# Patient Record
Sex: Male | Born: 1940 | Race: Black or African American | Hispanic: No | Marital: Married | Smoking: Former smoker
Health system: Southern US, Community
[De-identification: ages and names within clinical notes are randomized; demographics above are authoritative.]

## PROBLEM LIST (undated history)

## (undated) DIAGNOSIS — H269 Unspecified cataract: Secondary | ICD-10-CM

## (undated) DIAGNOSIS — H409 Unspecified glaucoma: Secondary | ICD-10-CM

## (undated) DIAGNOSIS — I10 Essential (primary) hypertension: Secondary | ICD-10-CM

## (undated) DIAGNOSIS — M109 Gout, unspecified: Secondary | ICD-10-CM

## (undated) HISTORY — PX: KNEE SURGERY: SHX244

## (undated) HISTORY — PX: MANDIBLE FRACTURE SURGERY: SHX706

## (undated) HISTORY — PX: CATARACT EXTRACTION: SUR2

## (undated) HISTORY — PX: COLONOSCOPY: SHX174

---

## 2005-04-07 ENCOUNTER — Emergency Department (HOSPITAL_COMMUNITY): Admission: EM | Admit: 2005-04-07 | Discharge: 2005-04-07 | Payer: Self-pay | Admitting: Emergency Medicine

## 2005-04-08 ENCOUNTER — Emergency Department (HOSPITAL_COMMUNITY): Admission: EM | Admit: 2005-04-08 | Discharge: 2005-04-08 | Payer: Self-pay | Admitting: Emergency Medicine

## 2005-04-10 ENCOUNTER — Emergency Department (HOSPITAL_COMMUNITY): Admission: EM | Admit: 2005-04-10 | Discharge: 2005-04-10 | Payer: Self-pay | Admitting: Emergency Medicine

## 2005-04-15 ENCOUNTER — Emergency Department (HOSPITAL_COMMUNITY): Admission: EM | Admit: 2005-04-15 | Discharge: 2005-04-15 | Payer: Self-pay | Admitting: Emergency Medicine

## 2014-12-08 DIAGNOSIS — Z961 Presence of intraocular lens: Secondary | ICD-10-CM | POA: Insufficient documentation

## 2014-12-08 DIAGNOSIS — H401113 Primary open-angle glaucoma, right eye, severe stage: Secondary | ICD-10-CM | POA: Insufficient documentation

## 2014-12-08 DIAGNOSIS — H2512 Age-related nuclear cataract, left eye: Secondary | ICD-10-CM | POA: Insufficient documentation

## 2015-10-15 ENCOUNTER — Emergency Department (HOSPITAL_COMMUNITY): Payer: Medicare HMO

## 2015-10-15 ENCOUNTER — Encounter (HOSPITAL_COMMUNITY): Payer: Self-pay | Admitting: Emergency Medicine

## 2015-10-15 ENCOUNTER — Other Ambulatory Visit: Payer: Self-pay

## 2015-10-15 ENCOUNTER — Observation Stay (HOSPITAL_COMMUNITY): Payer: Medicare HMO

## 2015-10-15 ENCOUNTER — Emergency Department (HOSPITAL_BASED_OUTPATIENT_CLINIC_OR_DEPARTMENT_OTHER): Payer: Medicare HMO

## 2015-10-15 ENCOUNTER — Inpatient Hospital Stay (HOSPITAL_COMMUNITY)
Admission: EM | Admit: 2015-10-15 | Discharge: 2015-10-18 | DRG: 176 | Disposition: A | Discharge status: Home / Self Care | Payer: Medicare HMO | Attending: Family Medicine | Admitting: Family Medicine

## 2015-10-15 DIAGNOSIS — I2699 Other pulmonary embolism without acute cor pulmonale: Secondary | ICD-10-CM

## 2015-10-15 DIAGNOSIS — Z87891 Personal history of nicotine dependence: Secondary | ICD-10-CM | POA: Diagnosis not present

## 2015-10-15 DIAGNOSIS — R0602 Shortness of breath: Secondary | ICD-10-CM | POA: Diagnosis not present

## 2015-10-15 DIAGNOSIS — H409 Unspecified glaucoma: Secondary | ICD-10-CM | POA: Diagnosis present

## 2015-10-15 DIAGNOSIS — I10 Essential (primary) hypertension: Secondary | ICD-10-CM | POA: Diagnosis present

## 2015-10-15 DIAGNOSIS — Z79899 Other long term (current) drug therapy: Secondary | ICD-10-CM

## 2015-10-15 DIAGNOSIS — H548 Legal blindness, as defined in USA: Secondary | ICD-10-CM | POA: Diagnosis present

## 2015-10-15 DIAGNOSIS — M109 Gout, unspecified: Secondary | ICD-10-CM | POA: Diagnosis present

## 2015-10-15 DIAGNOSIS — I2609 Other pulmonary embolism with acute cor pulmonale: Secondary | ICD-10-CM | POA: Diagnosis not present

## 2015-10-15 HISTORY — DX: Essential (primary) hypertension: I10

## 2015-10-15 HISTORY — DX: Unspecified glaucoma: H40.9

## 2015-10-15 HISTORY — DX: Unspecified cataract: H26.9

## 2015-10-15 HISTORY — DX: Gout, unspecified: M10.9

## 2015-10-15 LAB — BASIC METABOLIC PANEL
ANION GAP: 10 (ref 5–15)
BUN: 18 mg/dL (ref 6–20)
CO2: 20 mmol/L — AB (ref 22–32)
Calcium: 9.1 mg/dL (ref 8.9–10.3)
Chloride: 110 mmol/L (ref 101–111)
Creatinine, Ser: 1.39 mg/dL — ABNORMAL HIGH (ref 0.61–1.24)
GFR calc Af Amer: 55 mL/min — ABNORMAL LOW (ref >60)
GFR calc non Af Amer: 48 mL/min — ABNORMAL LOW (ref >60)
GLUCOSE: 107 mg/dL — AB (ref 65–99)
POTASSIUM: 3.5 mmol/L (ref 3.5–5.1)
Sodium: 140 mmol/L (ref 135–145)

## 2015-10-15 LAB — CBC WITH DIFFERENTIAL/PLATELET
BASOS ABS: 0 10*3/uL (ref 0.0–0.1)
Basophils Relative: 0 %
EOS ABS: 0.2 10*3/uL (ref 0.0–0.7)
EOS PCT: 3 %
HCT: 38 % — ABNORMAL LOW (ref 39.0–52.0)
HEMOGLOBIN: 13.1 g/dL (ref 13.0–17.0)
Lymphocytes Relative: 34 %
Lymphs Abs: 2.3 10*3/uL (ref 0.7–4.0)
MCH: 30.3 pg (ref 26.0–34.0)
MCHC: 34.5 g/dL (ref 30.0–36.0)
MCV: 87.8 fL (ref 78.0–100.0)
Monocytes Absolute: 0.3 10*3/uL (ref 0.1–1.0)
Monocytes Relative: 4 %
NEUTROS PCT: 59 %
Neutro Abs: 3.8 10*3/uL (ref 1.7–7.7)
PLATELETS: 155 10*3/uL (ref 150–400)
RBC: 4.33 MIL/uL (ref 4.22–5.81)
RDW: 12.6 % (ref 11.5–15.5)
WBC: 6.6 10*3/uL (ref 4.0–10.5)

## 2015-10-15 LAB — ECHOCARDIOGRAM COMPLETE
CHL CUP TV REG PEAK VELOCITY: 455 cm/s
E decel time: 317 msec
FS: 35 % (ref 28–44)
HEIGHTINCHES: 70 in
IVS/LV PW RATIO, ED: 0.99
LA vol index: 30.9 mL/m2
LA vol: 67 mL
LADIAMINDEX: 1.75 cm/m2
LASIZE: 38 mm
LAVOLA4C: 61.4 mL
LEFT ATRIUM END SYS DIAM: 38 mm
LV dias vol index: 29 mL/m2
LVDIAVOL: 64 mL (ref 62–150)
LVOT area: 3.8 cm2
LVOT diameter: 22 mm
LVSYSVOL: 19 mL — AB (ref 21–61)
LVSYSVOLIN: 9 mL/m2
MV Dec: 317
MV pk E vel: 78 m/s
MVPG: 2 mmHg
MVPKAVEL: 114 m/s
PW: 12.5 mm — AB (ref 0.6–1.1)
Simpson's disk: 71
Stroke v: 45 ml
TAPSE: 27.8 mm
TR max vel: 455 cm/s
WEIGHTICAEL: 3520 [oz_av]

## 2015-10-15 LAB — TROPONIN I

## 2015-10-15 LAB — BRAIN NATRIURETIC PEPTIDE: B Natriuretic Peptide: 42 pg/mL (ref 0.0–100.0)

## 2015-10-15 MED ORDER — SODIUM CHLORIDE 0.9 % IV SOLN: 250.0000 mL | INTRAVENOUS | Status: DC | PRN

## 2015-10-15 MED ORDER — SODIUM CHLORIDE 0.9% FLUSH
3.0000 mL | Freq: Two times a day (BID) | INTRAVENOUS | Status: DC
  Administered 2015-10-15 – 2015-10-18 (×6): 3 mL via INTRAVENOUS

## 2015-10-15 MED ORDER — SODIUM CHLORIDE 0.9 % IV SOLN
INTRAVENOUS | Status: DC
  Administered 2015-10-15: 10:00:00 via INTRAVENOUS

## 2015-10-15 MED ORDER — ALBUTEROL SULFATE (2.5 MG/3ML) 0.083% IN NEBU
2.5000 mg | INHALATION_SOLUTION | Freq: Once | RESPIRATORY_TRACT | Status: AC
  Administered 2015-10-15: 2.5 mg via RESPIRATORY_TRACT
  Filled 2015-10-15: qty 3

## 2015-10-15 MED ORDER — ONDANSETRON HCL 4 MG PO TABS: 4.0000 mg | ORAL_TABLET | Freq: Four times a day (QID) | ORAL | Status: DC | PRN

## 2015-10-15 MED ORDER — ACETAMINOPHEN 325 MG PO TABS: 650.0000 mg | ORAL_TABLET | Freq: Four times a day (QID) | ORAL | Status: DC | PRN

## 2015-10-15 MED ORDER — HEPARIN (PORCINE) IN NACL 100-0.45 UNIT/ML-% IJ SOLN
1500.0000 [IU]/h | INTRAMUSCULAR | Status: DC
  Administered 2015-10-15: 1500 [IU]/h via INTRAVENOUS
  Filled 2015-10-15: qty 250

## 2015-10-15 MED ORDER — APIXABAN 5 MG PO TABS: 5.0000 mg | ORAL_TABLET | Freq: Two times a day (BID) | ORAL | Status: DC

## 2015-10-15 MED ORDER — ACETAMINOPHEN 650 MG RE SUPP: 650.0000 mg | Freq: Four times a day (QID) | RECTAL | Status: DC | PRN

## 2015-10-15 MED ORDER — SODIUM CHLORIDE 0.9 % IV SOLN
INTRAVENOUS | Status: AC
  Administered 2015-10-15: 13:00:00 via INTRAVENOUS

## 2015-10-15 MED ORDER — APIXABAN 5 MG PO TABS
10.0000 mg | ORAL_TABLET | Freq: Two times a day (BID) | ORAL | Status: DC
  Administered 2015-10-15 – 2015-10-18 (×6): 10 mg via ORAL
  Filled 2015-10-15 (×6): qty 2

## 2015-10-15 MED ORDER — SODIUM CHLORIDE 0.9% FLUSH: 3.0000 mL | INTRAVENOUS | Status: DC | PRN

## 2015-10-15 MED ORDER — SENNOSIDES-DOCUSATE SODIUM 8.6-50 MG PO TABS: 1.0000 | ORAL_TABLET | Freq: Every evening | ORAL | Status: DC | PRN

## 2015-10-15 MED ORDER — IOPAMIDOL (ISOVUE-370) INJECTION 76%
100.0000 mL | Freq: Once | INTRAVENOUS | Status: AC | PRN
  Administered 2015-10-15: 100 mL via INTRAVENOUS

## 2015-10-15 MED ORDER — HEPARIN BOLUS VIA INFUSION
5000.0000 [IU] | Freq: Once | INTRAVENOUS | Status: AC
  Administered 2015-10-15: 5000 [IU] via INTRAVENOUS

## 2015-10-15 MED ORDER — ONDANSETRON HCL 4 MG/2ML IJ SOLN: 4.0000 mg | Freq: Four times a day (QID) | INTRAMUSCULAR | Status: DC | PRN

## 2015-10-15 MED ORDER — IPRATROPIUM-ALBUTEROL 0.5-2.5 (3) MG/3ML IN SOLN
3.0000 mL | Freq: Once | RESPIRATORY_TRACT | Status: AC
  Administered 2015-10-15: 3 mL via RESPIRATORY_TRACT
  Filled 2015-10-15: qty 3

## 2015-10-15 NOTE — ED Notes (Signed)
Patient with c/o shortness of breath x 2 weeks with worsening symptoms over last 2 days. C/o worse breathing on left. + extremity swelling. 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 Patient alert. Work of breathing increase with getting to bed from wheelchair.

## 2015-10-15 NOTE — ED Notes (Signed)
Pt placed on 2L nasal cannula.

## 2015-10-15 NOTE — ED Provider Notes (Signed)
CSN: [id number]     Arrival date & time 10/15/15  0909 History   First MD Initiated Contact with Patient 10/15/15 0912     Chief Complaint  Patient presents with  . Shortness of Breath     HPI  Pt was seen at 0915.  Per pt, c/o gradual onset and worsening of persistent SOB for the past 2 weeks, worse over the past 2 days. SOB worsens on exertion. Has been associated with increasing pedal edema bilaterally. Pt states he "went to my doctor and he told me I was ok."  Denies CP/palpitations, no cough, no abd pain, no N/V/D, no back pain, no fevers, no calf/LE pain or unilateral swelling.     Past Medical History  Diagnosis Date  . Glaucoma   . Cataract   . Hypertension   . Gout    Past Surgical History  Procedure Laterality Date  . Knee surgery    . Cataract extraction    . Mandible fracture surgery    . Colonoscopy      Social History  Substance Use Topics  . Smoking status: Former Games developer  . Smokeless tobacco: None  . Alcohol Use: No    Review of Systems ROS: Statement: All systems negative except as marked or noted in the HPI; Constitutional: Negative for fever and chills. ; ; Eyes: Negative for eye pain, redness and discharge. ; ; ENMT: Negative for ear pain, hoarseness, nasal congestion, sinus pressure and sore throat. ; ; Cardiovascular: Negative for chest pain, palpitations, diaphoresis, +dyspnea, +peripheral edema. ; ; Respiratory: Negative for cough, wheezing and stridor. ; ; Gastrointestinal: Negative for nausea, vomiting, diarrhea, abdominal pain, blood in stool, hematemesis, jaundice and rectal bleeding. . ; ; Genitourinary: Negative for dysuria, flank pain and hematuria. ; ; Musculoskeletal: Negative for back pain and neck pain. Negative for swelling and trauma.; ; Skin: Negative for pruritus, rash, abrasions, blisters, bruising and skin lesion.; ; Neuro: Negative for headache, lightheadedness and neck stiffness. Negative for weakness, altered level of consciousness,  altered mental status, extremity weakness, paresthesias, involuntary movement, seizure and syncope.     Allergies  Review of patient's allergies indicates no known allergies.  Home Medications   Prior to Admission medications   Not on File   BP 152/78 mmHg  Pulse 89  Temp(Src) 98.2 F (36.8 C) (Oral)  Resp 18  Ht  (1.778 m)  Wt 220 lb (99.791 kg)  BMI 31.57 kg/m2  SpO2 91%   Patient Vitals for the past 24 hrs:  BP Temp Temp src Pulse Resp SpO2 Height Weight  10/15/15 1130 152/78 mmHg - - 89 18 91 % - -  10/15/15 1104 - - - - - -  (1.778 m) 220 lb (99.791 kg)  10/15/15 1100 147/81 mmHg - - 72 16 98 % - -  10/15/15 1052 - - - - - 97 % - -  10/15/15 1000 134/73 mmHg - - 86 20 91 % - -  10/15/15 0930 167/79 mmHg 98.2 F (36.8 C) Oral 96 25 92 % - -     Physical Exam  0920: Physical examination:  Nursing notes reviewed; Vital signs and O2 SAT reviewed;  Constitutional: Well developed, Well nourished, Well hydrated, In no acute distress; Head:  Normocephalic, atraumatic; Eyes: EOMI, PERRL, No scleral icterus; ENMT: Mouth and pharynx normal, Mucous membranes moist; Neck: Supple, Full range of motion, No lymphadenopathy; Cardiovascular: Regular rate and rhythm, No gallop; Respiratory: Breath sounds coarse & equal bilaterally, No wheezes.  Speaking full sentences with ease, Normal respiratory effort/excursion; Chest: Nontender, Movement normal; Abdomen: Soft, Nontender, Nondistended, Normal bowel sounds; Genitourinary: No CVA tenderness; Extremities: Pulses normal, No tenderness, +chronic bilat ankles/lower legs deformities. +2 pedal edema bilat. No calf edema or asymmetry.; Neuro: AA&Ox3, Major CN grossly intact.  Speech clear. No gross focal motor or sensory deficits in extremities.; Skin: Color normal, Warm, Dry.    ED Course  Procedures (including critical care time) Labs Review   Imaging Review  I have personally reviewed and evaluated these images and lab results  as part of my medical decision-making.   EKG Interpretation   Date/Time:  Friday October 15 2015 09:17:50 EDT Ventricular Rate:  101 PR Interval:    QRS Duration: 94 QT Interval:  346 QTC Calculation: 449 R Axis:   -15 Text Interpretation:  Sinus tachycardia Borderline prolonged PR interval  Borderline left axis deviation Baseline wander No old tracing to compare  Confirmed by [facility]  MD, Nicholos JohnsKATHLEEN [id number]) on 10/15/2015 9:36:24 AM      MDM  MDM Reviewed: previous chart, nursing note and vitals Reviewed previous: labs and ECG Interpretation: labs, ECG, x-ray and CT scan Total time providing critical care: 30-74 minutes. This excludes time spent performing separately reportable procedures and services. Consults: critical care and admitting MD   CRITICAL CARE Performed by: Laray AngerMCMANUS,Jamicia Haaland M Total critical care time: 35 minutes Critical care time was exclusive of separately billable procedures and treating other patients. Critical care was necessary to treat or prevent imminent or life-threatening deterioration. Critical care was time spent personally by me on the following activities: development of treatment plan with patient and/or surrogate as well as nursing, discussions with consultants, evaluation of patient's response to treatment, examination of patient, obtaining history from patient or surrogate, ordering and performing treatments and interventions, ordering and review of laboratory studies, ordering and review of radiographic studies, pulse oximetry and re-evaluation of patient's condition.   Results for orders placed or performed during the hospital encounter of 10/15/15  Basic metabolic panel  Result Value Ref Range   Sodium 140 135 - 145 mmol/L   Potassium 3.5 3.5 - 5.1 mmol/L   Chloride 110 101 - 111 mmol/L   CO2 20 (L) 22 - 32 mmol/L   Glucose, Bld 107 (H) 65 - 99 mg/dL   BUN 18 6 - 20 mg/dL   Creatinine, Ser 9.811.39 (H) 0.61 - 1.24 mg/dL   Calcium 9.1 8.9 - 19.110.3  mg/dL   GFR calc non Af Amer 48 (L) >60 mL/min   GFR calc Af Amer 55 (L) >60 mL/min   Anion gap 10 5 - 15  Brain natriuretic peptide  Result Value Ref Range   B Natriuretic Peptide 42.0 0.0 - 100.0 pg/mL  Troponin I  Result Value Ref Range   Troponin I <0.03 <0.031 ng/mL  CBC with Differential  Result Value Ref Range   WBC 6.6 4.0 - 10.5 K/uL   RBC 4.33 4.22 - 5.81 MIL/uL   Hemoglobin 13.1 13.0 - 17.0 g/dL   HCT 47.838.0 (L) 29.539.0 - 62.152.0 %   MCV 87.8 78.0 - 100.0 fL   MCH 30.3 26.0 - 34.0 pg   MCHC 34.5 30.0 - 36.0 g/dL   RDW 30.812.6 65.711.5 - 84.615.5 %   Platelets 155 150 - 400 K/uL   Neutrophils Relative % 59 %   Neutro Abs 3.8 1.7 - 7.7 K/uL   Lymphocytes Relative 34 %   Lymphs Abs 2.3 0.7 - 4.0 K/uL   Monocytes  Relative 4 %   Monocytes Absolute 0.3 0.1 - 1.0 K/uL   Eosinophils Relative 3 %   Eosinophils Absolute 0.2 0.0 - 0.7 K/uL   Basophils Relative 0 %   Basophils Absolute 0.0 0.0 - 0.1 K/uL   Dg Chest 2 View 10/15/2015  CLINICAL DATA:  Shortness of breath for 1 week, worsening. Initial encounter. EXAM: CHEST  2 VIEW COMPARISON:  None. FINDINGS: There is cardiomegaly without edema. Blunting at the left costophrenic angle could be due to scar or small effusion. The lungs are clear. No pneumothorax. IMPRESSION: Cardiomegaly without edema. Blunting of the left costophrenic angle compatible with scar or a very small effusion. Electronically Signed   By: Drusilla Kannerhomas  Dalessio M.D.   On: 10/15/2015 09:55   Ct Angio Chest Pe W/cm &/or Wo Cm 10/15/2015  CLINICAL DATA:  Shortness of breath for 2 weeks, worsened over the past 2 days. Left lower extremity swelling. Initial encounter. EXAM: CT ANGIOGRAPHY CHEST WITH CONTRAST TECHNIQUE: Multidetector CT imaging of the chest was performed using the standard protocol during bolus administration of intravenous contrast. Multiplanar CT image reconstructions and MIPs were obtained to evaluate the vascular anatomy. CONTRAST:  100 cc Isovue 370. COMPARISON:  PA  and lateral chest earlier today. FINDINGS: The study is positive for extensive bilateral pulmonary emboli. The RV to LV ratio is 1.1 consistent with right heart strain. Small bilateral pleural effusions are seen and there is a small pericardial effusion. Heart size is mildly enlarged. Calcific aortic and coronary atherosclerosis is seen. Lungs demonstrate emphysematous disease and some dependent atelectasis. Visualized upper abdomen shows reflux of contrast into the inferior vena cava and hepatic veins consistent with right heart insufficiency. Imaged upper abdomen is otherwise unremarkable. Thoracic spondylosis is noted. No focal bony abnormality. Review of the MIP images confirms the above findings. IMPRESSION: Positive for acute PE with CT evidence of right heart strain (RV/LV Ratio = 1.1) consistent with at least submassive (intermediate risk) PE. The presence of right heart strain has been associated with an increased risk of morbidity and mortality. Please activate Code PE by paging [phone]. Small bilateral pleural effusions and pericardial effusion. Calcific aortic and coronary atherosclerosis. Critical Value/emergent results were called by telephone at the time of interpretation on 10/15/2015 at 10:43 am to Dr. Samuel JesterKATHLEEN Arial Galligan , who verbally acknowledged these results. Electronically Signed   By: Drusilla Kannerhomas  Dalessio M.D.   On: 10/15/2015 10:44    1043:  CT scan as above. IV heparin started. Code PE called. Pt VS remain stable, Sats 91-92% R/A. T/C from PCC[facility] Dr. Delton CoombesByrum, case discussed, including:  HPI, pertinent PM/SHx, VS/PE, dx testing, ED course and treatment:  Recommends start IV heparin, obtain Echocardiogram, [facility]  T/C to [facility] Dr. Ardyth HarpsHernandez, case discussed, including:  HPI, pertinent PM/SHx, VS/PE, dx testing, ED course and treatment:  Agreeable to admit, requests to write temporary orders, obtain stepdown bed to team APAdmits.   Samuel JesterKathleen Tai Skelly, DO 10/20/15 1242

## 2015-10-15 NOTE — Progress Notes (Addendum)
ANTICOAGULATION CONSULT NOTE - Initial Consult  Pharmacy Consult for heparin Indication: pulmonary embolus  Allergies  Allergen Reactions  . Codeine Other (See Comments)    "whelps and knots".    Patient Measurements:   Heparin Dosing Weight: 93.8  Vital Signs: Temp: 98.2 F (36.8 C) (06/23 0930) Temp Source: Oral (06/23 0930) BP: 134/73 mmHg (06/23 1000) Pulse Rate: 86 (06/23 1000)  Labs:  Recent Labs  10/15/15 0920  HGB 13.1  HCT 38.0*  PLT 155  CREATININE 1.39*  TROPONINI <0.03    CrCl cannot be calculated (Unknown ideal weight.).   Medical History: Past Medical History  Diagnosis Date  . Glaucoma   . Cataract   . Hypertension   . Gout     Medications:  See medication history  Assessment: 75 yo man with SOB for 2 weeks found to have submassive PE with evidence of right heart strain to start heparin therapy.  He was not on anticoagulation PTA. Goal of Therapy:  Heparin level 0.3-0.7 units/ml Monitor platelets by anticoagulation protocol: Yes   Plan:  Heparin 5000 units bolus and drip at 1500 units/hr Check heparin level 6 hours after start Daily heparin level and CBC while on heparin Monitor for bleeding complications  Thanks for allowing pharmacy to be a part of this patient's care.  Talbert CageLora Norwood Quezada, PharmD Clinical Pharmacist 10/15/2015,10:59 AM  Change to eliquis 10 mg bid for 7 days then 5 mg bid. Talbert CageLora Ewel Lona, PharmD

## 2015-10-15 NOTE — ED Notes (Signed)
Respiratory at bedside.

## 2015-10-15 NOTE — H&P (Signed)
History and Physical    James BinetFloyd W Axley ZOX:[id number] DOB: 07/23/1940 DOA: 10/15/2015  Referring MD/NP/PA: Samuel JesterKathleen Page, EDP [facility]  Patient coming from: Home  Chief Complaint: Shortness of breath  HPI: James Page is a 75 y.o. male with history significant for hypertension and glaucoma causing blindness who comes to the hospital with shortness of breath. He states has been present for about 3 weeks and has been progressive but got particularly worse last night prompting his ED visit today. Not hypoxic on arrival, hemodynamically stable, CT image of the chest showed acute PE with CT evidence for right heart strain consistent with at least massive PE. Admission has been requested for further evaluation and management.  Past Medical/Surgical History: Past Medical History  Diagnosis Date  . Glaucoma   . Cataract   . Hypertension   . Gout     Past Surgical History  Procedure Laterality Date  . Knee surgery    . Cataract extraction    . Mandible fracture surgery    . Colonoscopy     Social History:  reports that he has quit smoking. He does not have any smokeless tobacco history on file. He reports that he does not drink alcohol or use illicit drugs.  Allergies: Allergies  Allergen Reactions  . Codeine Other (See Comments)    "whelps and knots".    Family History:  No heart disease, stroke in either parent  Prior to Admission medications   Medication Sig Start Date End Date Taking? Authorizing Provider  colchicine 0.6 MG tablet Take 0.6 mg by mouth daily as needed (gout).   Yes Historical Provider, MD  doxazosin (CARDURA) 8 MG tablet Take 1 tablet by mouth daily. 09/24/15  Yes Historical Provider, MD  GARLIC PO Take 1 tablet by mouth daily.   Yes Historical Provider, MD  losartan (COZAAR) 25 MG tablet Take 1 tablet by mouth daily. 08/16/15  Yes Historical Provider, MD    Review of Systems:  Constitutional: Denies fever, chills,  diaphoresis, appetite change and fatigue.  HEENT: Denies photophobia, eye pain, redness, hearing loss, ear pain, congestion, sore throat, rhinorrhea, sneezing, mouth sores, trouble swallowing, neck pain, neck stiffness and tinnitus.   Respiratory:  cough, chest tightness,  and wheezing.   Cardiovascular: Denies chest pain, palpitations and leg swelling.  Gastrointestinal: Denies nausea, vomiting, abdominal pain, diarrhea, constipation, blood in stool and abdominal distention.  Genitourinary: Denies dysuria, urgency, frequency, hematuria, flank pain and difficulty urinating.  Endocrine: Denies: hot or cold intolerance, sweats, changes in hair or nails, polyuria, polydipsia. Musculoskeletal: Denies myalgias, back pain, joint swelling, arthralgias and gait problem.  Skin: Denies pallor, rash and wound.  Neurological: Denies dizziness, seizures, syncope, weakness, light-headedness, numbness and headaches.  Hematological: Denies adenopathy. Easy bruising, personal or family bleeding history  Psychiatric/Behavioral: Denies suicidal ideation, mood changes, confusion, nervousness, sleep disturbance and agitation    Physical Exam: Filed Vitals:   10/15/15 1315 10/15/15 1400 10/15/15 1500 10/15/15 1600  BP: 135/76 138/74 139/77 144/84  Pulse: 71 72 66 59  Temp:      TempSrc:      Resp: 20 22 21 17   Height:      Weight:      SpO2: 97% 96% 97% 97%     Constitutional: NAD, calm, comfortable Eyes: PERRL, lids and conjunctivae normal ENMT: Mucous membranes are moist. Posterior pharynx clear of any exudate or lesions.Normal dentition.  Neck: normal, supple, no masses, no thyromegaly Respiratory: clear to auscultation  bilaterally, no wheezing, no crackles. Normal respiratory effort. No accessory muscle use.  Cardiovascular: Regular rate and rhythm, no murmurs / rubs / gallops. No extremity edema. 2+ pedal pulses. No carotid bruits.  Abdomen: no tenderness, no masses palpated. No hepatosplenomegaly.  Bowel sounds positive.  Musculoskeletal: no clubbing / cyanosis. No joint deformity upper and lower extremities. Good ROM, no contractures. Normal muscle tone.  Skin: no rashes, lesions, ulcers. No induration Neurologic: CN 2-12 grossly intact. Sensation intact, DTR normal. Strength 5/5 in all 4.  Psychiatric: Normal judgment and insight. Alert and oriented x 3. Normal mood.    Labs on Admission: I have personally reviewed the following labs and imaging studies  CBC:  Recent Labs Lab 10/15/15 0920  WBC 6.6  NEUTROABS 3.8  HGB 13.1  HCT 38.0*  MCV 87.8  PLT 155   Basic Metabolic Panel:  Recent Labs Lab 10/15/15 0920  NA 140  K 3.5  CL 110  CO2 20*  GLUCOSE 107*  BUN 18  CREATININE 1.39*  CALCIUM 9.1   GFR: Estimated Creatinine Clearance: 53.5 mL/min (by C-G formula based on Cr of 1.39). Liver Function Tests: No results for input(s): AST, ALT, ALKPHOS, BILITOT, PROT, ALBUMIN in the last 168 hours. No results for input(s): LIPASE, AMYLASE in the last 168 hours. No results for input(s): AMMONIA in the last 168 hours. Coagulation Profile: No results for input(s): INR, PROTIME in the last 168 hours. Cardiac Enzymes:  Recent Labs Lab 10/15/15 0920  TROPONINI <0.03   BNP (last 3 results) No results for input(s): PROBNP in the last 8760 hours. HbA1C: No results for input(s): HGBA1C in the last 72 hours. CBG: No results for input(s): GLUCAP in the last 168 hours. Lipid Profile: No results for input(s): CHOL, HDL, LDLCALC, TRIG, CHOLHDL, LDLDIRECT in the last 72 hours. Thyroid Function Tests: No results for input(s): TSH, T4TOTAL, FREET4, T3FREE, THYROIDAB in the last 72 hours. Anemia Panel: No results for input(s): VITAMINB12, FOLATE, FERRITIN, TIBC, IRON, RETICCTPCT in the last 72 hours. Urine analysis: No results found for: COLORURINE, APPEARANCEUR, LABSPEC, PHURINE, GLUCOSEU, HGBUR, BILIRUBINUR, KETONESUR, PROTEINUR, UROBILINOGEN, NITRITE, LEUKOCYTESUR Sepsis  Labs: (procalcitonin:4,lacticidven:4) )No results found for this or any previous visit (from the past 240 hour(s)).   Radiological Exams on Admission: Dg Chest 2 View  10/15/2015  CLINICAL DATA:  Shortness of breath for 1 week, worsening. Initial encounter. EXAM: CHEST  2 VIEW COMPARISON:  None. FINDINGS: There is cardiomegaly without edema. Blunting at the left costophrenic angle could be due to scar or small effusion. The lungs are clear. No pneumothorax. IMPRESSION: Cardiomegaly without edema. Blunting of the left costophrenic angle compatible with scar or a very small effusion. Electronically Signed   By: Drusilla Kanner M.D.   On: 10/15/2015 09:55   Ct Angio Chest Pe W/cm &/or Wo Cm  10/15/2015  CLINICAL DATA:  Shortness of breath for 2 weeks, worsened over the past 2 days. Left lower extremity swelling. Initial encounter. EXAM: CT ANGIOGRAPHY CHEST WITH CONTRAST TECHNIQUE: Multidetector CT imaging of the chest was performed using the standard protocol during bolus administration of intravenous contrast. Multiplanar CT image reconstructions and MIPs were obtained to evaluate the vascular anatomy. CONTRAST:  100 cc Isovue 370. COMPARISON:  PA and lateral chest earlier today. FINDINGS: The study is positive for extensive bilateral pulmonary emboli. The RV to LV ratio is 1.1 consistent with right heart strain. Small bilateral pleural effusions are seen and there is a small pericardial effusion. Heart size is mildly enlarged. Calcific  aortic and coronary atherosclerosis is seen. Lungs demonstrate emphysematous disease and some dependent atelectasis. Visualized upper abdomen shows reflux of contrast into the inferior vena cava and hepatic veins consistent with right heart insufficiency. Imaged upper abdomen is otherwise unremarkable. Thoracic spondylosis is noted. No focal bony abnormality. Review of the MIP images confirms the above findings. IMPRESSION: Positive for acute PE with CT evidence of  right heart strain (RV/LV Ratio = 1.1) consistent with at least submassive (intermediate risk) PE. The presence of right heart strain has been associated with an increased risk of morbidity and mortality. Please activate Code PE by paging [phone]. Small bilateral pleural effusions and pericardial effusion. Calcific aortic and coronary atherosclerosis. Critical Value/emergent results were called by telephone at the time of interpretation on 10/15/2015 at 10:43 am to Dr. Samuel JesterKATHLEEN Page , who verbally acknowledged these results. Electronically Signed   By: Drusilla Kannerhomas  Dalessio M.D.   On: 10/15/2015 10:44    EKG: Independently reviewed. Sinus rhythm, left axis deviation, no acute ischemic changes  Assessment/Plan Principal Problem:   Pulmonary emboli (HCC)    Pulmonary embolus with significant clot burden -With evidence of right heart strain on CT, 2-D echo shows significant interventricular flattening and elevated PA pressure of greater than 80. -Has been started on heparin drip, have discussed anticoagulation with patient and have decided to transition over to Eliquis. -Admit to stepdown unit overnight to monitor vital signs given large clot burden, consider discharge home over the next 24-48 hours stable.   DVT prophylaxis: On full dose anticoagulation  Code Status: Full code  Family Communication: Wife James Page and sister at bedside updated on plan of care and all questions answered  Disposition Plan: Hope for discharge home over the next 24-48 hours  Consults called: None  Admission status: Inpatient    Time Spent: 70 minutes  Chaya JanHERNANDEZ ACOSTA,ESTELA MD [facility] Pager [phone]  If 7PM-7AM, please contact night-coverage [email or web address] Password [email or web address]  10/15/2015, 4:47 PM

## 2015-10-15 NOTE — ED Notes (Signed)
EDP at bedside updating patient and family. 

## 2015-10-16 ENCOUNTER — Inpatient Hospital Stay (HOSPITAL_COMMUNITY): Payer: Medicare HMO

## 2015-10-16 LAB — CBC
HCT: 36.6 % — ABNORMAL LOW (ref 39.0–52.0)
Hemoglobin: 12.3 g/dL — ABNORMAL LOW (ref 13.0–17.0)
MCH: 29.6 pg (ref 26.0–34.0)
MCHC: 33.6 g/dL (ref 30.0–36.0)
MCV: 88.2 fL (ref 78.0–100.0)
PLATELETS: 147 10*3/uL — AB (ref 150–400)
RBC: 4.15 MIL/uL — ABNORMAL LOW (ref 4.22–5.81)
RDW: 12.8 % (ref 11.5–15.5)
WBC: 5.5 10*3/uL (ref 4.0–10.5)

## 2015-10-16 LAB — BASIC METABOLIC PANEL
Anion gap: 5 (ref 5–15)
BUN: 15 mg/dL (ref 6–20)
CALCIUM: 8.8 mg/dL — AB (ref 8.9–10.3)
CHLORIDE: 112 mmol/L — AB (ref 101–111)
CO2: 21 mmol/L — ABNORMAL LOW (ref 22–32)
CREATININE: 1.27 mg/dL — AB (ref 0.61–1.24)
GFR calc Af Amer: >60 mL/min (ref >60)
GFR, EST NON AFRICAN AMERICAN: 53 mL/min — AB (ref >60)
Glucose, Bld: 81 mg/dL (ref 65–99)
Potassium: 4.1 mmol/L (ref 3.5–5.1)
SODIUM: 138 mmol/L (ref 135–145)

## 2015-10-16 LAB — MRSA PCR SCREENING: MRSA BY PCR: NEGATIVE

## 2015-10-16 NOTE — Progress Notes (Signed)
SATURATION QUALIFICATIONS: (This note is used to comply with regulatory documentation for home oxygen) ? ?Patient Saturations on Room Air at Rest = 94% ? ?Patient Saturations on Room Air while Ambulating = 87% ? ?Patient Saturations on 3 Liters of oxygen while Ambulating = 92% ? ?Please briefly explain why patient needs home oxygen: ? ?

## 2015-10-16 NOTE — Progress Notes (Signed)
PROGRESS NOTE    James Page  ZOX:[id number] DOB: 06/06/1940 DOA: 10/15/2015 [facility]     Brief Narrative:  75 year old man admitted to the hospital 6/23 with complaints of shortness of breath. Found to have acute PE with CT evidence for right heart strain consistent with at least massive PE.   Assessment & Plan:   Principal Problem:   Pulmonary emboli (HCC)   Pulmonary emboli -Continue Eliquis. -Remains hemodynamically stable, transfer to floor, increase activity.   DVT prophylaxis: On full dose anticoagulation Code Status: Full code Family Communication: Multiple family members at bedside Disposition Plan: Transfer to telemetry, likely discharge home in 24-48 hours  Consultants:   None  Procedures:   None  Antimicrobials:   None    Subjective: Feels well, will like to start walking  Objective: Filed Vitals:   10/16/15 0600 10/16/15 0700 10/16/15 0800 10/16/15 0900  BP: 138/74 152/71 160/81 153/74  Pulse: 54 57 63 70  Temp:   97.5 F (36.4 C)   TempSrc:   Oral   Resp: 15 19 20 22   Height:      Weight:      SpO2: 99% 98% 98% 98%    Intake/Output Summary (Last 24 hours) at 10/16/15 1051 Last data filed at 10/16/15 0921  Gross per 24 hour  Intake 1175.17 ml  Output    975 ml  Net 200.17 ml   Filed Weights   10/15/15 1104 10/15/15 1315 10/16/15 0400  Weight: 99.791 kg (220 lb) 99.9 kg (220 lb 3.8 oz) 97.8 kg (215 lb 9.8 oz)    Examination:  General exam: Alert, awake, oriented x 3 Respiratory system: Clear to auscultation. Respiratory effort normal. Cardiovascular system:RRR. No murmurs, rubs, gallops. Gastrointestinal system: Abdomen is nondistended, soft and nontender. No organomegaly or masses felt. Normal bowel sounds heard. Central nervous system: Alert and oriented. No focal neurological deficits. Extremities: 1+ pitting edema bilaterally Skin: No rashes, lesions or ulcers Psychiatry: Judgement and  insight appear normal. Mood & affect appropriate.     Data Reviewed: I have personally reviewed following labs and imaging studies  CBC:  Recent Labs Lab 10/15/15 0920 10/16/15 0428  WBC 6.6 5.5  NEUTROABS 3.8  --   HGB 13.1 12.3*  HCT 38.0* 36.6*  MCV 87.8 88.2  PLT 155 147*   Basic Metabolic Panel:  Recent Labs Lab 10/15/15 0920 10/16/15 0428  NA 140 138  K 3.5 4.1  CL 110 112*  CO2 20* 21*  GLUCOSE 107* 81  BUN 18 15  CREATININE 1.39* 1.27*  CALCIUM 9.1 8.8*   GFR: Estimated Creatinine Clearance: 58 mL/min (by C-G formula based on Cr of 1.27). Liver Function Tests: No results for input(s): AST, ALT, ALKPHOS, BILITOT, PROT, ALBUMIN in the last 168 hours. No results for input(s): LIPASE, AMYLASE in the last 168 hours. No results for input(s): AMMONIA in the last 168 hours. Coagulation Profile: No results for input(s): INR, PROTIME in the last 168 hours. Cardiac Enzymes:  Recent Labs Lab 10/15/15 0920  TROPONINI <0.03   BNP (last 3 results) No results for input(s): PROBNP in the last 8760 hours. HbA1C: No results for input(s): HGBA1C in the last 72 hours. CBG: No results for input(s): GLUCAP in the last 168 hours. Lipid Profile: No results for input(s): CHOL, HDL, LDLCALC, TRIG, CHOLHDL, LDLDIRECT in the last 72 hours. Thyroid Function Tests: No results for input(s): TSH, T4TOTAL, FREET4, T3FREE, THYROIDAB in the last 72 hours. Anemia Panel: No  results for input(s): VITAMINB12, FOLATE, FERRITIN, TIBC, IRON, RETICCTPCT in the last 72 hours. Urine analysis: No results found for: COLORURINE, APPEARANCEUR, LABSPEC, PHURINE, GLUCOSEU, HGBUR, BILIRUBINUR, KETONESUR, PROTEINUR, UROBILINOGEN, NITRITE, LEUKOCYTESUR Sepsis Labs: @LABRCNTIP (procalcitonin:4,lacticidven:4)  )No results found for this or any previous visit (from the past 240 hour(s)).       Radiology Studies: Dg Chest 2 View  10/15/2015  CLINICAL DATA:  Shortness of breath for 1 week,  worsening. Initial encounter. EXAM: CHEST  2 VIEW COMPARISON:  None. FINDINGS: There is cardiomegaly without edema. Blunting at the left costophrenic angle could be due to scar or small effusion. The lungs are clear. No pneumothorax. IMPRESSION: Cardiomegaly without edema. Blunting of the left costophrenic angle compatible with scar or a very small effusion. Electronically Signed   By: Drusilla Kannerhomas  Dalessio M.D.   On: 10/15/2015 09:55   Ct Angio Chest Pe W/cm &/or Wo Cm  10/15/2015  CLINICAL DATA:  Shortness of breath for 2 weeks, worsened over the past 2 days. Left lower extremity swelling. Initial encounter. EXAM: CT ANGIOGRAPHY CHEST WITH CONTRAST TECHNIQUE: Multidetector CT imaging of the chest was performed using the standard protocol during bolus administration of intravenous contrast. Multiplanar CT image reconstructions and MIPs were obtained to evaluate the vascular anatomy. CONTRAST:  100 cc Isovue 370. COMPARISON:  PA and lateral chest earlier today. FINDINGS: The study is positive for extensive bilateral pulmonary emboli. The RV to LV ratio is 1.1 consistent with right heart strain. Small bilateral pleural effusions are seen and there is a small pericardial effusion. Heart size is mildly enlarged. Calcific aortic and coronary atherosclerosis is seen. Lungs demonstrate emphysematous disease and some dependent atelectasis. Visualized upper abdomen shows reflux of contrast into the inferior vena cava and hepatic veins consistent with right heart insufficiency. Imaged upper abdomen is otherwise unremarkable. Thoracic spondylosis is noted. No focal bony abnormality. Review of the MIP images confirms the above findings. IMPRESSION: Positive for acute PE with CT evidence of right heart strain (RV/LV Ratio = 1.1) consistent with at least submassive (intermediate risk) PE. The presence of right heart strain has been associated with an increased risk of morbidity and mortality. Please activate Code PE by paging  [phone]. Small bilateral pleural effusions and pericardial effusion. Calcific aortic and coronary atherosclerosis. Critical Value/emergent results were called by telephone at the time of interpretation on 10/15/2015 at 10:43 am to Dr. Samuel JesterKATHLEEN MCMANUS , who verbally acknowledged these results. Electronically Signed   By: Drusilla Kannerhomas  Dalessio M.D.   On: 10/15/2015 10:44        Scheduled Meds: . apixaban  10 mg Oral BID   Followed by  . [START ON 10/22/2015] apixaban  5 mg Oral BID  . sodium chloride flush  3 mL Intravenous Q12H   Continuous Infusions:    LOS: 1 day    Time spent: 25 minutes. Greater than 50% of this time was spent in direct contact with the patient coordinating care.     Chaya JanHERNANDEZ ACOSTA,ESTELA, MD [facility] Pager [phone]  If 7PM-7AM, please contact night-coverage [email or web address] Password [email or web address] 10/16/2015, 10:51 AM

## 2015-10-16 NOTE — Discharge Instructions (Addendum)
Information on my medicine - ELIQUIS (apixaban)  This medication education was reviewed with me or my healthcare representative as part of my discharge preparation.  The pharmacist that spoke with me during my hospital stay was:  Woodfin GanjaSeay, Lora Poteet, [facility]  Why was Eliquis prescribed for you? Eliquis was prescribed to treat blood clots that may have been found in the veins of your legs (deep vein thrombosis) or in your lungs (pulmonary embolism) and to reduce the risk of them occurring again.  What do You need to know about Eliquis ? The starting dose is 10 mg (two 5 mg tablets) taken TWICE daily for the FIRST SEVEN (7) DAYS, then on 6/30 pm,  the dose is reduced to ONE 5 mg tablet taken TWICE daily.  Eliquis may be taken with or without food.   Try to take the dose about the same time in the morning and in the evening. If you have difficulty swallowing the tablet whole please discuss with your pharmacist how to take the medication safely.  Take Eliquis exactly as prescribed and DO NOT stop taking Eliquis without talking to the doctor who prescribed the medication.  Stopping may increase your risk of developing a new blood clot.  Refill your prescription before you run out.  After discharge, you should have regular check-up appointments with your healthcare provider that is prescribing your Eliquis.    What do you do if you miss a dose? If a dose of ELIQUIS is not taken at the scheduled time, take it as soon as possible on the same day and twice-daily administration should be resumed. The dose should not be doubled to make up for a missed dose.  Important Safety Information A possible side effect of Eliquis is bleeding. You should call your healthcare provider right away if you experience any of the following: ? Bleeding from an injury or your nose that does not stop. ? Unusual colored urine (red or dark brown) or unusual colored stools (red or black). ? Unusual bruising for unknown  reasons. ? A serious fall or if you hit your head (even if there is no bleeding).  Some medicines may interact with Eliquis and might increase your risk of bleeding or clotting while on Eliquis. To help avoid this, consult your healthcare provider or pharmacist prior to using any new prescription or non-prescription medications, including herbals, vitamins, non-steroidal anti-inflammatory drugs (NSAIDs) and supplements.  This website has more information on Eliquis (apixaban): http://www.eliquis.com/eliquis/home

## 2015-10-16 NOTE — Progress Notes (Signed)
Report called to [facility]nie on unit 300. Patient ambulated around nursing station on room air. Patient transferred to room 307 via wheelchair.

## 2015-10-17 DIAGNOSIS — I2609 Other pulmonary embolism with acute cor pulmonale: Secondary | ICD-10-CM

## 2015-10-17 LAB — BASIC METABOLIC PANEL
ANION GAP: 7 (ref 5–15)
BUN: 16 mg/dL (ref 6–20)
CO2: 22 mmol/L (ref 22–32)
Calcium: 8.9 mg/dL (ref 8.9–10.3)
Chloride: 110 mmol/L (ref 101–111)
Creatinine, Ser: 1.24 mg/dL (ref 0.61–1.24)
GFR, EST NON AFRICAN AMERICAN: 55 mL/min — AB (ref >60)
Glucose, Bld: 128 mg/dL — ABNORMAL HIGH (ref 65–99)
POTASSIUM: 3.6 mmol/L (ref 3.5–5.1)
SODIUM: 139 mmol/L (ref 135–145)

## 2015-10-17 NOTE — Progress Notes (Signed)
Patient Demographics:    James Page, is a 75 y.o. male, DOB - 10/08/1940, ZOX:[id number]  Admit date - 10/15/2015   Admitting Physician Henderson CloudEstela Y Hernandez Acosta, MD  [facility]patient [facility]  LOS - 2   Chief Complaint  Patient presents with  . Shortness of Breath        Subjective:    James MaudlinFloyd Sorbello today has no fevers, no emesis,  No chest pain,  No sob   Assessment  & Plan :    Principal Problem:   Pulmonary emboli (HCC)   1)Submassive pulmonary embolism-echocardiogram with preserved left ventricular function EF over 60%, no  evidence for right heart strain at this time, continue Eliquis and oxygen supplementation. Hemodynamically stable at this time     Code Status : full   Disposition Plan  : home in am on Eliquis    DVT Prophylaxis  :  Eliquis  Lab Results  Component Value Date   PLT 147* 10/16/2015    Inpatient Medications  Scheduled Meds: . apixaban  10 mg Oral BID   Followed by  . [START ON 10/22/2015] apixaban  5 mg Oral BID  . sodium chloride flush  3 mL Intravenous Q12H   Continuous Infusions:  PRN Meds:.sodium chloride, acetaminophen **OR** acetaminophen, ondansetron **OR** ondansetron (ZOFRAN) IV, senna-docusate, sodium chloride flush    Anti-infectives    None        Objective:   Filed Vitals:   10/16/15 1642 10/16/15 2151 10/16/15 2230 10/17/15 0658  BP: 145/72  132/64 152/72  Pulse: 66  65 64  Temp: 98.5 F (36.9 C)  98.8 F (37.1 C) 99.2 F (37.3 C)  TempSrc: Oral  Oral Oral  Resp: 18  20 15   Height:      Weight:      SpO2: 98% 98% 99% 100%    Wt Readings from Last 3 Encounters:  10/16/15 97.8 kg (215 lb 9.8 oz)     Intake/Output Summary (Last 24 hours) at 10/17/15 1136 Last data filed at 10/17/15 [id number]  Gross per 24 hour  Intake    720 ml  Output   1400 ml  Net   -680 ml      Physical Exam  Gen:- Awake Alert,   HEENT:- [place].AT, No sclera icterus, 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  Lungs-  CTAB  CV- S1, S2 normal Abd-  +ve B.Sounds, Abd Soft, No tenderness,    Extremity/Skin:-  LE discomfort/swelling    Data Review:   Micro Results Recent Results (from the past 240 hour(s))  MRSA PCR Screening     Status: None   Collection Time: 10/16/15  1:44 PM  Result Value Ref Range Status   MRSA by PCR NEGATIVE NEGATIVE Final    Comment:        The GeneXpert MRSA Assay (FDA approved for NASAL specimens only), is one component of a comprehensive MRSA colonization surveillance program. It is not intended to diagnose MRSA infection nor to guide or monitor treatment for MRSA infections.     Radiology Reports Dg Chest 2 View  10/15/2015  CLINICAL DATA:  Shortness of breath for 1 week, worsening. Initial encounter. EXAM: CHEST  2 VIEW COMPARISON:  None. FINDINGS: There is cardiomegaly without edema. Blunting at the left costophrenic angle could be due to scar or small effusion. The lungs are clear. No pneumothorax. IMPRESSION: Cardiomegaly without edema. Blunting of the left costophrenic angle compatible with scar or a very small effusion. Electronically Signed   By: Drusilla Kanner M.D.   On: 10/15/2015 09:55   Ct Angio Chest Pe W/cm &/or Wo Cm  10/15/2015  CLINICAL DATA:  Shortness of breath for 2 weeks, worsened over the past 2 days. Left lower extremity swelling. Initial encounter. EXAM: CT ANGIOGRAPHY CHEST WITH CONTRAST TECHNIQUE: Multidetector CT imaging of the chest was performed using the standard protocol during bolus administration of intravenous contrast. Multiplanar CT image reconstructions and MIPs were obtained to evaluate the vascular anatomy. CONTRAST:  100 cc Isovue 370. COMPARISON:  PA and lateral chest earlier today. FINDINGS: The study is positive for extensive bilateral pulmonary emboli. The RV to LV ratio is  1.1 consistent with right heart strain. Small bilateral pleural effusions are seen and there is a small pericardial effusion. Heart size is mildly enlarged. Calcific aortic and coronary atherosclerosis is seen. Lungs demonstrate emphysematous disease and some dependent atelectasis. Visualized upper abdomen shows reflux of contrast into the inferior vena cava and hepatic veins consistent with right heart insufficiency. Imaged upper abdomen is otherwise unremarkable. Thoracic spondylosis is noted. No focal bony abnormality. Review of the MIP images confirms the above findings. IMPRESSION: Positive for acute PE with CT evidence of right heart strain (RV/LV Ratio = 1.1) consistent with at least submassive (intermediate risk) PE. The presence of right heart strain has been associated with an increased risk of morbidity and mortality. Please activate Code PE by paging [phone]. Small bilateral pleural effusions and pericardial effusion. Calcific aortic and coronary atherosclerosis. Critical Value/emergent results were called by telephone at the time of interpretation on 10/15/2015 at 10:43 am to Dr. Samuel Jester , who verbally acknowledged these results. Electronically Signed   By: Drusilla Kanner M.D.   On: 10/15/2015 10:44     CBC  Recent Labs Lab 10/15/15 0920 10/16/15 0428  WBC 6.6 5.5  HGB 13.1 12.3*  HCT 38.0* 36.6*  PLT 155 147*  MCV 87.8 88.2  MCH 30.3 29.6  MCHC 34.5 33.6  RDW 12.6 12.8  LYMPHSABS 2.3  --   MONOABS 0.3  --   EOSABS 0.2  --   BASOSABS 0.0  --     Chemistries   Recent Labs Lab 10/15/15 0920 10/16/15 0428 10/17/15 0913  NA 140 138 139  K 3.5 4.1 3.6  CL 110 112* 110  CO2 20* 21* 22  GLUCOSE 107* 81 128*  BUN CREATININE 1.39* 1.27* 1.24  CALCIUM 9.1 8.8* 8.9   ------------------------------------------------------------------------------------------------------------------ No results for input(s): CHOL, HDL, LDLCALC, TRIG, CHOLHDL, LDLDIRECT  in the last 72 hours.  No results found for: HGBA1C ------------------------------------------------------------------------------------------------------------------ No results for input(s): TSH, T4TOTAL, T3FREE, THYROIDAB in the last 72 hours.  Invalid input(s): FREET3 ------------------------------------------------------------------------------------------------------------------ No results for input(s): VITAMINB12, FOLATE, FERRITIN, TIBC, IRON, RETICCTPCT in the last 72 hours.  Coagulation profile No results for input(s): INR, PROTIME in the last 168 hours.  No results for input(s): DDIMER in the last 72 hours.  Cardiac Enzymes  Recent Labs Lab 10/15/15 0920  TROPONINI <0.03   ------------------------------------------------------------------------------------------------------------------    Component Value Date/Time   BNP 42.0 10/15/2015 0920     Temica Righetti M.D on 10/17/2015 at 11:36 AM  Between 7am to  7pm - Pager - [phone]  After 7pm go to [email or web address] - password [email or web address]  [facility] -  Office  [phone]  Dragon dictation system was used to create this note, attempts have been made to correct errors, however presence of uncorrected errors is not a reflection quality of care provided

## 2015-10-18 DIAGNOSIS — H548 Legal blindness, as defined in USA: Secondary | ICD-10-CM | POA: Diagnosis present

## 2015-10-18 DIAGNOSIS — I2699 Other pulmonary embolism without acute cor pulmonale: Principal | ICD-10-CM | POA: Diagnosis present

## 2015-10-18 DIAGNOSIS — I10 Essential (primary) hypertension: Secondary | ICD-10-CM | POA: Diagnosis present

## 2015-10-18 LAB — CBC
HCT: 35.6 % — ABNORMAL LOW (ref 39.0–52.0)
Hemoglobin: 12.1 g/dL — ABNORMAL LOW (ref 13.0–17.0)
MCH: 30 pg (ref 26.0–34.0)
MCHC: 34 g/dL (ref 30.0–36.0)
MCV: 88.1 fL (ref 78.0–100.0)
PLATELETS: 166 10*3/uL (ref 150–400)
RBC: 4.04 MIL/uL — ABNORMAL LOW (ref 4.22–5.81)
RDW: 12.1 % (ref 11.5–15.5)
WBC: 6.1 10*3/uL (ref 4.0–10.5)

## 2015-10-18 MED ORDER — SENNOSIDES-DOCUSATE SODIUM 8.6-50 MG PO TABS: 2.0000 | ORAL_TABLET | Freq: Every day | ORAL | Status: AC

## 2015-10-18 MED ORDER — APIXABAN 5 MG PO TABS: ORAL_TABLET | ORAL | Status: DC

## 2015-10-18 NOTE — Progress Notes (Signed)
Patient states understanding of discharge, prescriptions given 

## 2015-10-18 NOTE — Care Management (Signed)
Per NT, pt oxygen sat dropped to 87% with ambulation. Pt has no qualifying DX. Pt states he does not need or want oxygen, stating he  "will be fine after he walks some". Pt's wife at bedside and understands they would have to pay OOP with no chronic resp dx. Pt's wife has no questions and does not wish to pay OOP for home O2.

## 2015-10-18 NOTE — Care Management Important Message (Signed)
Important Message  Patient Details  Name: James Page MRN: [id number] Date of Birth: 11/19/1940   Medicare Important Message Given:  Yes    Malcolm MetroChildress, Xavior Niazi Demske, RN 10/18/2015, 9:14 AM

## 2015-10-18 NOTE — Care Management Note (Signed)
Case Management Note  Patient Details  Name: James Page MRN: [id number] Date of Birth: 07/08/1940  Subjective/Objective:                  Pt is from home, lives with wife and is ind with ADL's. Pt is blind. Pt admitted with DVT. Pt given Eliquis voucher for 30-day supply by pharm. Pt has no DME or HH needs at DC.   Action/Plan: Discharging home today. No CM needs.   Expected Discharge Date:     10/18/2015             Expected Discharge Plan:  Home/Self Care  In-House Referral:  NA  Discharge planning Services  CM Consult  Post Acute Care Choice:  NA Choice offered to:  NA  DME Arranged:    DME Agency:     HH Arranged:    HH Agency:     Status of Service:  Completed, signed off  If discussed at [employer] Length of Stay Meetings, dates discussed:    Additional Comments:  Malcolm MetroChildress, Yanina Knupp Demske, RN 10/18/2015, 10:28 AM

## 2015-10-18 NOTE — Discharge Summary (Addendum)
James Page, is a 75 y.o. male  DOB 07/02/1940  MRN [id number].  Admission date:  10/15/2015  Admitting Physician  Henderson CloudEstela Y Hernandez Acosta, MD  Discharge Date:  10/18/2015   Primary MD  [facility]  Recommendations for primary care physician for things to follow:   Take 10 mg (2 tabs) twice a day for 5 days, last dose 10/23/2015, then decrease to 5 mg twice daily for 6 months starting 10/24/15   Admission Diagnosis  Bilateral pulmonary embolism (HCC) [I26.99]   Discharge Diagnosis  Bilateral pulmonary embolism (HCC) [I26.99]    Principal Problem:   Pulmonary emboli (HCC) Active Problems:   Legally blind   Bilateral pulmonary embolism (HCC)   HTN (hypertension)      Past Medical History  Diagnosis Date  . Glaucoma   . Cataract   . Hypertension   . Gout     Past Surgical History  Procedure Laterality Date  . Knee surgery    . Cataract extraction    . Mandible fracture surgery    . Colonoscopy         HPI  from the history and physical done on the day of admission:    75 year old man admitted to the hospital 6/23 with complaints of shortness of breath. Found to have acute PE with CT evidence for right heart strain consistent with at least submassive PE.  Chief Complaint: Shortness of breath  HPI: James Page is a 75 y.o. male with history significant for hypertension and glaucoma causing blindness who comes to the hospital with shortness of breath. He states has been present for about 3 weeks and has been progressive but got particularly worse last night prompting his ED visit today. Not hypoxic on arrival, hemodynamically stable, CT image of the chest showed acute PE with CT evidence for right heart strain consistent with at least massive PE. Admission has been requested for further evaluation and management.    Hospital Course:      75 year old man  admitted to the hospital 6/23 with complaints of shortness of breath. Found to have acute PE with CT evidence for right heart strain consistent with at least submassive PE.   1)Acute pulmonary embolism- CT chest suggested right heart strain consistent with at least submassive PE, echocardiogram with preserved left ventricular function EF over 60%, no evidence for right heart strain at this time,  hypoxia improved, patient was weaned off oxygen.  Hemodynamically stable at this time. Take 10 mg (2 tabs) twice a day for 5 days, last dose 10/23/2015, then decrease to 5 mg twice daily for 6 months starting 10/24/15  2)HTN-continue losartan  Discharge Condition: Follow-up with PCP within a week, call if any bleeding concerns  Follow UP  Follow-up Information    [facility] In 1 week.   Contact information:   [address] [place] [id number] [phone]         Diet and Activity recommendation:  As  advised  Discharge Instructions        Discharge Instructions    Call MD for:  difficulty breathing, headache or visual disturbances    Complete by:  As directed      Call MD for:  persistant dizziness or light-headedness    Complete by:  As directed      Call MD for:  persistant nausea and vomiting    Complete by:  As directed      Call MD for:  severe uncontrolled pain    Complete by:  As directed      Call MD for:  temperature >100.4    Complete by:  As directed      Diet - low sodium heart healthy    Complete by:  As directed      Discharge instructions    Complete by:  As directed   Take medications as prescribed, call if any concerns about bleeding, follow up with PCP as advised     Increase activity slowly    Complete by:  As directed           Take 10 mg (2 tabs) twice a day for 5 days, last dose 10/23/2015, then decrease to 5 mg twice daily for 6 months starting 10/24/15   Discharge Medications       Medication List    TAKE  these medications        apixaban 5 MG Tabs tablet  Commonly known as:  ELIQUIS  Take 10 mg (2 tabs) twice a day for 5 days, last dose 10/23/2015, then decrease to 5 mg twice daily for 6 months starting 10/24/15     colchicine 0.6 MG tablet  Take 0.6 mg by mouth daily as needed (gout).     doxazosin 8 MG tablet  Commonly known as:  CARDURA  Take 1 tablet by mouth daily.     GARLIC PO  Take 1 tablet by mouth daily.     losartan 25 MG tablet  Commonly known as:  COZAAR  Take 1 tablet by mouth daily.     senna-docusate 8.6-50 MG tablet  Commonly known as:  Senokot-S  Take 2 tablets by mouth at bedtime.        Major procedures and Radiology Reports - PLEASE review detailed and final reports for all details, in brief -   Dg Chest 2 View  10/15/2015  CLINICAL DATA:  Shortness of breath for 1 week, worsening. Initial encounter. EXAM: CHEST  2 VIEW COMPARISON:  None. FINDINGS: There is cardiomegaly without edema. Blunting at the left costophrenic angle could be due to scar or small effusion. The lungs are clear. No pneumothorax. IMPRESSION: Cardiomegaly without edema. Blunting of the left costophrenic angle compatible with scar or a very small effusion. Electronically Signed   By: Drusilla Kanner M.D.   On: 10/15/2015 09:55   Ct Angio Chest Pe W/cm &/or Wo Cm  10/15/2015  CLINICAL DATA:  Shortness of breath for 2 weeks, worsened over the past 2 days. Left lower extremity swelling. Initial encounter. EXAM: CT ANGIOGRAPHY CHEST WITH CONTRAST TECHNIQUE: Multidetector CT imaging of the chest was performed using the standard protocol during bolus administration of intravenous contrast. Multiplanar CT image reconstructions and MIPs were obtained to evaluate the vascular anatomy. CONTRAST:  100 cc Isovue 370. COMPARISON:  PA and lateral chest earlier today. FINDINGS: The study is positive for extensive bilateral pulmonary emboli. The RV to LV ratio is 1.1 consistent with right heart strain. Small  bilateral  pleural effusions are seen and there is a small pericardial effusion. Heart size is mildly enlarged. Calcific aortic and coronary atherosclerosis is seen. Lungs demonstrate emphysematous disease and some dependent atelectasis. Visualized upper abdomen shows reflux of contrast into the inferior vena cava and hepatic veins consistent with right heart insufficiency. Imaged upper abdomen is otherwise unremarkable. Thoracic spondylosis is noted. No focal bony abnormality. Review of the MIP images confirms the above findings. IMPRESSION: Positive for acute PE with CT evidence of right heart strain (RV/LV Ratio = 1.1) consistent with at least submassive (intermediate risk) PE. The presence of right heart strain has been associated with an increased risk of morbidity and mortality. Please activate Code PE by paging [phone]. Small bilateral pleural effusions and pericardial effusion. Calcific aortic and coronary atherosclerosis. Critical Value/emergent results were called by telephone at the time of interpretation on 10/15/2015 at 10:43 am to Dr. Samuel JesterKATHLEEN MCMANUS , who verbally acknowledged these results. Electronically Signed   By: Drusilla Kannerhomas  Dalessio M.D.   On: 10/15/2015 10:44    Micro Results    Recent Results (from the past 240 hour(s))  MRSA PCR Screening     Status: None   Collection Time: 10/16/15  1:44 PM  Result Value Ref Range Status   MRSA by PCR NEGATIVE NEGATIVE Final    Comment:        The GeneXpert MRSA Assay (FDA approved for NASAL specimens only), is one component of a comprehensive MRSA colonization surveillance program. It is not intended to diagnose MRSA infection nor to guide or monitor treatment for MRSA infections.        Today   Subjective    James Page today has no New complaints, no chest pains no shortness of breath no dyspnea on exertion, no palpitations no dizziness          Patient has been seen and examined prior to discharge   Objective    Blood pressure 146/67, pulse 67, temperature 98.6 F (37 C), temperature source Oral, resp. rate 18, height 5\' 10"  (1.778 m), weight 97.8 kg (215 lb 9.8 oz), SpO2 98 %.   Intake/Output Summary (Last 24 hours) at 10/18/15 1223 Last data filed at 10/18/15 0610  Gross per 24 hour  Intake    240 ml  Output    600 ml  Net   -360 ml    Exam Gen:- Awake  , in no apparent distress  HEENT:- [place].AT,  legally blind Neck-Supple Neck,No JVD,  Lungs- mostly clear  CV- S1, S2 normal Abd-  +ve B.Sounds, Abd Soft, No tenderness,    Extremity/Skin:- Intact peripheral pulses  , negative Homans   Data Review   CBC w Diff:  Lab Results  Component Value Date   WBC 6.1 10/18/2015   HGB 12.1* 10/18/2015   HCT 35.6* 10/18/2015   PLT 166 10/18/2015   LYMPHOPCT 34 10/15/2015   MONOPCT 4 10/15/2015   EOSPCT 3 10/15/2015   BASOPCT 0 10/15/2015    CMP:  Lab Results  Component Value Date   NA 139 10/17/2015   K 3.6 10/17/2015   CL 110 10/17/2015   CO2 22 10/17/2015   BUN 16 10/17/2015   CREATININE 1.24 10/17/2015  .   Total Discharge time is about 33 minutes  Norman Piacentini M.D on 10/18/2015 at 12:23 PM  [facility]   Office  [phone]  Dragon dictation system was used to create this note, attempts have been made to correct errors, however presence of uncorrected errors is not  a reflection quality of care provided
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
## 2015-12-20 ENCOUNTER — Encounter (HOSPITAL_COMMUNITY): Payer: Self-pay | Admitting: Hematology & Oncology

## 2015-12-20 ENCOUNTER — Encounter (HOSPITAL_COMMUNITY): Payer: Medicare HMO | Attending: Hematology & Oncology | Admitting: Hematology & Oncology

## 2015-12-20 VITALS — BP 136/57 | HR 62 | Temp 97.7°F | Resp 18 | Ht 69.0 in | Wt 225.8 lb

## 2015-12-20 DIAGNOSIS — I2699 Other pulmonary embolism without acute cor pulmonale: Secondary | ICD-10-CM

## 2015-12-20 DIAGNOSIS — Z7901 Long term (current) use of anticoagulants: Secondary | ICD-10-CM

## 2015-12-20 DIAGNOSIS — I2609 Other pulmonary embolism with acute cor pulmonale: Secondary | ICD-10-CM | POA: Insufficient documentation

## 2015-12-20 LAB — D-DIMER, QUANTITATIVE: D-Dimer, Quant: 0.27 ug/mL-FEU (ref 0.00–0.50)

## 2015-12-20 NOTE — Patient Instructions (Addendum)
[facility] Discharge Instructions  RECOMMENDATIONS MADE BY THE CONSULTANT AND ANY TEST RESULTS WILL BE SENT TO YOUR REFERRING PHYSICIAN.  You saw Dr. Galen ManilaPenland today. You will return to us in 3-4 weeks, please see schedule for appointment date and time. We drew labs today.  We will call you with the results once they all return.  Some of these tests take a few days to result completely.    Thank you for choosing [facility] at [facility] to provide your oncology and hematology care.  To afford each patient quality time with our provider, please arrive at least 15 minutes before your scheduled appointment time.   Beginning January 23rd 2017 lab work for the [facility] will be done in the  Main lab at [facility] on 1st floor. If you have a lab appointment with the Cancer Center please come in thru the  Main Entrance and check in at the main information desk  You need to re-schedule your appointment should you arrive 10 or more minutes late.  We strive to give you quality time with our providers, and arriving late affects you and other patients whose appointments are after yours.  Also, if you no show three or more times for appointments you may be dismissed from the clinic at the providers discretion.     Again, thank you for choosing [facility].  Our hope is that these requests will decrease the amount of time that you wait before being seen by our physicians.       _____________________________________________________________  Should you have questions after your visit to [facility], please contact our office at [phone] between the hours of 8:30 a.m. and 4:30 p.m.  Voicemails left after 4:30 p.m. will not be returned until the following business day.  For prescription refill requests, have your pharmacy contact our office.         Resources For Cancer Patients and their Caregivers ? American  Cancer Society: Can assist with transportation, wigs, general needs, runs Look Good Feel Better.        [phone] ? Cancer Care: Provides financial assistance, online support groups, medication/co-pay assistance.  1-800-813-HOPE 650-791-4213(4673) ? [facility] Assists [place] cancer patients and their families through emotional , educational and financial support.  [phone] ? [place] DSS Where to apply for food stamps, Medicaid and utility assistance. [phone] ? [facility]: Transportation to medical appointments. [phone] ? Social Security Administration: May apply for disability if have a Stage IV cancer. ([phone] [phone] ? [place] [employer], [employer] and Transit Services: Assists with nutrition, care and transit needs. (928)066-62166134778932  Cancer Center Support Programs: @10RELATIVEDAYS @ > Cancer Support Group  2nd Tuesday of the month 1pm-2pm, Journey Room  > Creative Journey  3rd Tuesday of the month 1130am-1pm, Journey Room  > Look Good Feel Better  1st Wednesday of the month 10am-12 noon, Journey Room (Call American Cancer Society to register [phone])

## 2015-12-21 ENCOUNTER — Other Ambulatory Visit (HOSPITAL_COMMUNITY): Payer: Medicare HMO

## 2015-12-21 LAB — PROTEIN S ACTIVITY: Protein S Activity: 158 % — ABNORMAL HIGH (ref 63–140)

## 2015-12-21 LAB — ANTITHROMBIN III: AntiThromb III Func: 93 % (ref 75–120)

## 2015-12-21 LAB — PROTEIN C ACTIVITY: Protein C Activity: 97 % (ref 73–180)

## 2015-12-21 LAB — BETA-2-GLYCOPROTEIN I ABS, IGG/M/A
Beta-2 Glyco I IgG: <9 GPI IgG units (ref 0–20)
Beta-2-Glycoprotein I IgM: <9 GPI IgM units (ref 0–32)

## 2015-12-21 LAB — PROTEIN S, TOTAL: PROTEIN S AG TOTAL: 103 % (ref 60–150)

## 2015-12-22 LAB — CARDIOLIPIN ANTIBODIES, IGG, IGM, IGA
Anticardiolipin IgA: <9 APL U/mL (ref 0–11)
Anticardiolipin IgG: <9 GPL U/mL (ref 0–14)
Anticardiolipin IgM: 9 MPL U/mL (ref 0–12)

## 2015-12-22 LAB — PROTEIN C, TOTAL: PROTEIN C, TOTAL: 84 % (ref 60–150)

## 2015-12-23 LAB — FACTOR 5 LEIDEN

## 2015-12-24 LAB — PROTHROMBIN GENE MUTATION

## 2015-12-26 ENCOUNTER — Encounter (HOSPITAL_COMMUNITY): Payer: Self-pay | Admitting: Hematology & Oncology

## 2016-01-17 ENCOUNTER — Ambulatory Visit (HOSPITAL_COMMUNITY): Payer: Medicare HMO | Admitting: Hematology & Oncology

## 2016-02-18 ENCOUNTER — Encounter (HOSPITAL_COMMUNITY): Payer: Medicare HMO | Attending: Hematology & Oncology | Admitting: Hematology & Oncology

## 2016-02-18 ENCOUNTER — Encounter (HOSPITAL_COMMUNITY): Payer: Self-pay | Admitting: Hematology & Oncology

## 2016-02-18 VITALS — BP 137/56 | HR 74 | Temp 98.0°F | Resp 16 | Wt 227.8 lb

## 2016-02-18 DIAGNOSIS — I2699 Other pulmonary embolism without acute cor pulmonale: Secondary | ICD-10-CM | POA: Diagnosis not present

## 2016-02-18 DIAGNOSIS — I2609 Other pulmonary embolism with acute cor pulmonale: Secondary | ICD-10-CM | POA: Insufficient documentation

## 2016-02-18 DIAGNOSIS — Z7901 Long term (current) use of anticoagulants: Secondary | ICD-10-CM | POA: Diagnosis not present

## 2016-02-18 MED ORDER — RIVAROXABAN 20 MG PO TABS: 20.0000 mg | ORAL_TABLET | Freq: Every day | ORAL | 3 refills | Status: DC

## 2016-02-18 NOTE — Progress Notes (Signed)
[facility]  Progress Note  Patient Care Team: [facility] as PCP - General  CHIEF COMPLAINTS/PURPOSE OF CONSULTATION:  Bilateral Pulmonary Emboli CTA chest with R heart strain c/w at least submassive PE 10/15/2015 Xarelto  HISTORY OF PRESENTING ILLNESS:  TION TSE 75 y.o. male is here for additional follow-up of bilateral pulmonary emboli. He reported a several week history of SOB that got progressively worse prompting him to present to the ED on 10/15/2015. CTA of the chest showed an acute PE with CT evidence of R heart strain, c/w at least submassive PE.  He was admitted for further stabilization. ECHO was performed on 6/23 showing and EF of 60-65%, there were no regional wall motion abnormalities, systolic pressures were increased. He was weaned off O2 without difficulty.   Mr. Ruhlman returns to the Cancer Center today accompanied by his wife.   He was told by his insurance that the Xarelto medication was too high and that it would no longer be covered. He is curious what his other options would be. He is on [employer] and [address]. He was previously prescribed Eliquis which was not covered by his insurance either.   He notes no difficulties with his anticoagulation. He tolerates it well. He denies any hematuria, BRBPR. No nosebleeds. Appetite is good. Breathing is baseline. Energy is good. No CP.  MEDICAL HISTORY:  Past Medical History:  Diagnosis Date  . Cataract   . Glaucoma   . Gout   . Hypertension     SURGICAL HISTORY: Past Surgical History:  Procedure Laterality Date  . CATARACT EXTRACTION    . COLONOSCOPY    . KNEE SURGERY    . MANDIBLE FRACTURE SURGERY      SOCIAL HISTORY: Social History   Social History  . Marital status: Married    Spouse name: N/A  . Number of children: N/A  . Years of education: N/A   Occupational History  . Not on file.   Social History Main Topics  . Smoking status: Former Games developer  .  Smokeless tobacco: Never Used  . Alcohol use No  . Drug use: No  . Sexual activity: Not on file     Comment: Married   Other Topics Concern  . Not on file   Social History Narrative  . No narrative on file  Married 54 years, one child living, one child deceased. One step grandchild. Was a truck Investment banker, corporate. 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   FAMILY HISTORY: History reviewed. No pertinent family history. Father died at the age of 46, blood was "too thick" Mother died in her 47's from a CVA 8 half siblings 5 full siblings  No siblings with history of DVT or PE  ALLERGIES:  is allergic to codeine.  MEDICATIONS:  Current Outpatient Prescriptions  Medication Sig Dispense Refill  . colchicine 0.6 MG tablet Take 0.6 mg by mouth daily as needed (gout).    Marland Kitchen doxazosin (CARDURA) 8 MG tablet Take 1 tablet by mouth daily.    Marland Kitchen GARLIC PO Take 1 tablet by mouth daily.    Marland Kitchen losartan (COZAAR) 25 MG tablet Take 1 tablet by mouth daily.  2  . rivaroxaban (XARELTO) 20 MG TABS tablet Take 1 tablet (20 mg total) by mouth daily with supper. 30 tablet 3  . senna-docusate (SENOKOT-S) 8.6-50 MG tablet Take 2 tablets by mouth at bedtime. 60 tablet 0   No current facility-administered medications for this visit.  Review of Systems  Constitutional: Negative.   HENT: Negative.   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   Cardiovascular: Negative.   Gastrointestinal: Negative.   Genitourinary: Negative.   Musculoskeletal: Positive for joint pain. Negative for falls.  Skin: Negative.   Neurological: Negative.   Endo/Heme/Allergies: Negative.   Psychiatric/Behavioral: Negative.    14 point ROS was done and is otherwise as detailed above or in HPI   PHYSICAL EXAMINATION: ECOG PERFORMANCE STATUS: 1 - Symptomatic but completely ambulatory  Vitals:   02/18/16 0942  BP: (!) 137/56  Pulse: 74  Resp: 16  Temp: 98 F (36.7 C)   Filed Weights   02/18/16 0942    Weight: 227 lb 12.8 oz (103.3 kg)     Physical Exam  Constitutional: He is oriented to person, place, and time and well-developed, well-nourished, and in no distress.  HENT:  Head: Normocephalic and atraumatic.  Nose: Nose normal.  Mouth/Throat: Oropharynx is clear and moist. No oropharyngeal exudate.  Eyes: Conjunctivae and EOM are normal. Pupils are equal, round, and reactive to light. Right eye exhibits no discharge. Left eye exhibits no discharge. No scleral icterus.  Neck: Normal range of motion. Neck supple. No tracheal deviation present. No thyromegaly present.  Cardiovascular: Normal rate, regular rhythm and normal heart sounds.  Exam reveals no gallop and no friction rub.   No murmur heard. Pulmonary/Chest: Effort normal and breath sounds normal. He has no wheezes. He has no rales.  Abdominal: Soft. Bowel sounds are normal. He exhibits no distension and no mass. There is no tenderness. There is no rebound and no guarding.  Musculoskeletal: Normal range of motion. He exhibits deformity. He exhibits no edema.  Chronic L ankle discoloration  Lymphadenopathy:    He has no cervical adenopathy.  Neurological: He is alert and oriented to person, place, and time. He has normal reflexes. No cranial nerve deficit. Coordination abnormal.  Skin: Skin is warm and dry. No rash noted.  Psychiatric: Mood, memory, affect and judgment normal.  Nursing note and vitals reviewed.   LABORATORY DATA:  I have reviewed the data as listed Lab Results  Component Value Date   WBC 6.1 10/18/2015   HGB 12.1 (L) 10/18/2015   HCT 35.6 (L) 10/18/2015   MCV 88.1 10/18/2015   PLT 166 10/18/2015   CMP     Component Value Date/Time   NA 139 10/17/2015 0913   K 3.6 10/17/2015 0913   CL 110 10/17/2015 0913   CO2 22 10/17/2015 0913   GLUCOSE 128 (H) 10/17/2015 0913   BUN 16 10/17/2015 0913   CREATININE 1.24 10/17/2015 0913   CALCIUM 8.9 10/17/2015 0913   GFRNONAA 55 (L) 10/17/2015 0913   GFRAA >60  10/17/2015 0913     RADIOGRAPHIC STUDIES: I have personally reviewed the radiological images as listed and agreed with the findings in the report. Study Result   CLINICAL DATA:  Shortness of breath for 2 weeks, worsened over the past 2 days. Left lower extremity swelling. Initial encounter.  EXAM: CT ANGIOGRAPHY CHEST WITH CONTRAST  TECHNIQUE: Multidetector CT imaging of the chest was performed using the standard protocol during bolus administration of intravenous contrast. Multiplanar CT image reconstructions and MIPs were obtained to evaluate the vascular anatomy.  CONTRAST:  100 cc Isovue 370.  COMPARISON:  PA and lateral chest earlier today.  FINDINGS: The study is positive for extensive bilateral pulmonary emboli. The RV to LV ratio is 1.1 consistent with  right heart strain. Small bilateral pleural effusions are seen and there is a small pericardial effusion. Heart size is mildly enlarged. Calcific aortic and coronary atherosclerosis is seen. Lungs demonstrate emphysematous disease and some dependent atelectasis.  Visualized upper abdomen shows reflux of contrast into the inferior vena cava and hepatic veins consistent with right heart insufficiency. Imaged upper abdomen is otherwise unremarkable. Thoracic spondylosis is noted. No focal bony abnormality.  Review of the MIP images confirms the above findings.  IMPRESSION: Positive for acute PE with CT evidence of right heart strain (RV/LV Ratio = 1.1) consistent with at least submassive (intermediate risk) PE. The presence of right heart strain has been associated with an increased risk of morbidity and mortality. Please activate Code PE by paging [phone].  Small bilateral pleural effusions and pericardial effusion.  Calcific aortic and coronary atherosclerosis.  Critical Value/emergent results were called by telephone at the time of interpretation on 10/15/2015 at 10:43 am to Dr. Samuel Jester  , who verbally acknowledged these results.   Electronically Signed   By: Drusilla Kanner M.D.   On: 10/15/2015 10:44    No results found.  ASSESSMENT & PLAN:  Bilateral Pulmonary Emboli Submassive PE CTA chest with R heart strain c/w at least submassive PE 10/15/2015 Xarelto  We discussed his PE today. We discussed duration of anticoagulation. He has no prior history of DVT/PE and no significant family history although he notes his father died because "his blood was too thick." Thrombophilia evaluation was negative  We have opted to continue anticoagulation at this point given we cannot identify a triggering/risk factor for his PE.   I spoke with the patient and his wife again about unprovoked blood clots. All questions were answered.   The patient has financial concerns about receiving his Xarelto. I will speak with Angie about this and whether he can receive co-pay assistance. He has Medicare / [employer]. We discussed other options including coumadin.   He will return for follow up in 6 months.  From Up to Date SUMMARY AND RECOMMENDATIONS ?Most patients with a first episode of venous thromboembolism (VTE; proximal deep venous thrombosis [DVT] and/or pulmonary embolus [PE]) are anticoagulated for a finite period of 3 to 12 months. Select patients benefit from indefinite anticoagulation which is administered with the primary goal of reducing the lifetime risk of recurrent thrombosis and VTE-associated death.  ?The decision to anticoagulate indefinitely should be individualized and based upon an estimate of the risk of recurrence and bleeding in the context of the patient's values and preferences . In general, the following applies  .For most patients with a first episode of unprovoked proximal DVT, unprovoked symptomatic PE, or active cancer in whom the risk of bleeding is low to moderate, we suggest indefinite anticoagulation rather than stopping therapy after 3 to 12 months .  In patients with a recurrent episode of unprovoked VTE, we recommend indefinite anticoagulation rather than stopping therapy after 3 to 12 months . (See  .Indefinite anticoagulation should not be routinely administered to patients with a provoked episode of VTE with major transient risk factors (eg, surgery, cessation of hormonal therapy) . We also avoid indefinite anticoagulation in those with a high bleeding risk; however, should the risk for bleeding resolve, indefinite anticoagulation may be reconsidered.  .For most patients with recurrent provoked VTE or a first episode of provoked VTE with irreversible, multiple, or minor risk factors, a first episode of unprovoked isolated distal DVT or an unprovoked episode of incidental PE, therapy must  be individualized based upon a careful assessment of patient-specific risks of bleeding and thrombosis. There are wide variations in both the recurrence risk and benefit in these populations.  ?For every patient in whom indefinite anticoagulation is being considered, the initial assessment should evaluate the baseline risk of recurrence that is associated with the clinical nature of the event (eg, provoked, unprovoked, presence and type of risk factors).  .The presence of one or more additional major risk factors (eg, active malignancy) or minor risk factors (eg, male gender) may favor continued anticoagulation after a conventional period of 3 to 12 months.  .Factors that affect bleeding risk (eg, older age >75 years, frequent falls) and patient values and preferences (eg, burden of therapeutic monitoring) strongly impact the decision to continue or discontinue anticoagulation beyond the conventional period.  ?Most patients who choose indefinite anticoagulation continue their current anticoagulant rather than switch to a new agent. However, alternate agents may need to be initiated for a variety of reasons during therapy. For most patients, we suggest full intensity  anticoagulation (eg, warfarin with international normalized ratio target 2 to 3) rather than low intensity regimens or aspirin . However, aspirin may be prescribed to those who decline anticoagulant therapy, provided there is no contraindication. (See 'Agent selection' above.)  ?Patients receiving indefinite anticoagulation are seen at least annually. During follow up patients should be assessed for recurrence, adequacy of therapeutic control, the development of contraindications to anticoagulation, altered bleeding risk, and chronic hemorrhage, as well as for changes in agent preference.    Using a bleeding risk model containing a comprehensive list of 17 risk factors, the rate of major bleeding was estimated, by the [employer] of [employer] Physicians as follows: ?Low risk (no risk factors present) - 1.6 percent during the first three months; 0.8 percent/year thereafter ?Intermediate risk (one risk factor present) - 3.2 percent during the first three months; 1.6 percent/year thereafter ?High risk (two or more risk factors) - 12.8 percent during the first three months; ?6.5 percent/year thereafter Risk factors associated with bleeding during anticoagulant therapy include the following : ?Older age (>65 years; assessed as two risk factors if >75 years) ?Prior bleeding (especially if not correctable) ?Cancer (assessed as two risk factors if metastatic or highly vascular) ?Renal insufficiency ?Liver failure ?Diabetes mellitus  ?Thrombocytopenia ?Prior stroke (especially if hemorrhagic) ?Anemia ?Concomitant antiplatelet or nonsteroidal antiinflammatory therapy ?Recent surgery ?Frequent falls ?Alcohol abuse ?Reduced functional capacity ?Poor control of warfarin therapy  The risk of bleeding is proportionate to the number of risk factors present. For example, young patients without additional risks for bleeding who have been well-controlled on warfarin therapy have a low risk of bleeding (eg, ?1  percent) whereas older patients with multiple risk factors and at risk of falling have a high risk of bleeding (>4 percent).  Although a number of tools are available to predict a patient's risk of bleeding while on anticoagulation (eg, HAS-BLED bleeding risk score, (calculator 1)), none have been validated for patients with VTE  Thus, when considering indefinite anticoagulation, we suggest the use of gestalt estimates to weigh the risk of bleeding against the benefit of preventing recurrence  MEDICATIONS PRESCRIBED THIS ENCOUNTER: Meds ordered this encounter  Medications  . rivaroxaban (XARELTO) 20 MG TABS tablet    Sig: Take 1 tablet (20 mg total) by mouth daily with supper.    Dispense:  30 tablet    Refill:  3   All questions were answered. The patient knows to call the clinic with any  problems, questions or concerns.  This document serves as a record of services personally performed by Loma Messing, MD. It was created on her behalf by Delana Meyer, a trained medical scribe. The creation of this record is based on the scribe's personal observations and the provider's statements to them. This document has been checked and approved by the attending provider.  I have reviewed the above documentation for accuracy and completeness and I agree with the above.  This note was electronically signed.    Arvil Chaco, MD  03/18/2016 9:14 AM

## 2016-02-18 NOTE — Patient Instructions (Addendum)
[facility] at [facility] Discharge Instructions  RECOMMENDATIONS MADE BY THE CONSULTANT AND ANY TEST RESULTS WILL BE SENT TO YOUR REFERRING PHYSICIAN.  You saw Dr.Penland today. Follow up in 6 months. See Amy at checkout for appointments.  Thank you for choosing [facility] at [facility] to provide your oncology and hematology care.  To afford each patient quality time with our provider, please arrive at least 15 minutes before your scheduled appointment time.   Beginning January 23rd 2017 lab work for the Cancer Center will be done in the  Main lab at [place] on 1st floor. If you have a lab appointment with the Cancer Center please come in thru the  Main Entrance and check in at the main information desk  You need to re-schedule your appointment should you arrive 10 or more minutes late.  We strive to give you quality time with our providers, and arriving late affects you and other patients whose appointments are after yours.  Also, if you no show three or more times for appointments you may be dismissed from the clinic at the providers discretion.     Again, thank you for choosing [facility].  Our hope is that these requests will decrease the amount of time that you wait before being seen by our physicians.       _____________________________________________________________  Should you have questions after your visit to [facility], please contact our office at [phone] between the hours of 8:30 a.m. and 4:30 p.m.  Voicemails left after 4:30 p.m. will not be returned until the following business day.  For prescription refill requests, have your pharmacy contact our office.         Resources For Cancer Patients and their Caregivers ? American Cancer Society: Can assist with transportation, wigs, general needs, runs Look Good Feel Better.        1-888-227-6333 ? Cancer Care: Provides financial  assistance, online support groups, medication/co-pay assistance.  1-800-813-HOPE (4673) ? [facility] Assists [place] cancer patients and their families through emotional , educational and financial support.  336-427-4357 ? [place] DSS Where to apply for food stamps, Medicaid and utility assistance. 336-342-1394 ? [facility]: Transportation to medical appointments. 336-347-2287 ? Social Security Administration: May apply for disability if have a Stage IV cancer. 336-342-7796 1-800-772-1213 ? [place] Aging, Disability and Transit Services: Assists with nutrition, care and transit needs. 336-349-2343  Cancer Center Support Programs: @10RELATIVEDAYS@ > Cancer Support Group  2nd Tuesday of the month 1pm-2pm, Journey Room  > Creative Journey  3rd Tuesday of the month 1130am-1pm, Journey Room  > Look Good Feel Better  1st Wednesday of the month 10am-12 noon, Journey Room (Call American Cancer Society to register 1-800-395-5775)   

## 2016-02-29 ENCOUNTER — Telehealth (HOSPITAL_COMMUNITY): Payer: Self-pay | Admitting: Hematology & Oncology

## 2016-02-29 NOTE — Telephone Encounter (Signed)
copay assistance denied by j and j

## 2016-03-18 ENCOUNTER — Encounter (HOSPITAL_COMMUNITY): Payer: Self-pay | Admitting: Hematology & Oncology

## 2016-06-08 ENCOUNTER — Other Ambulatory Visit (HOSPITAL_COMMUNITY): Payer: Self-pay | Admitting: Nephrology

## 2016-06-08 DIAGNOSIS — N183 Chronic kidney disease, stage 3 unspecified: Secondary | ICD-10-CM

## 2016-07-13 ENCOUNTER — Ambulatory Visit (HOSPITAL_COMMUNITY)
Admission: RE | Admit: 2016-07-13 | Discharge: 2016-07-13 | Disposition: A | Discharge status: Home / Self Care | Payer: Medicare Other | Source: Ambulatory Visit | Attending: Nephrology | Admitting: Nephrology

## 2016-07-13 DIAGNOSIS — N281 Cyst of kidney, acquired: Secondary | ICD-10-CM | POA: Insufficient documentation

## 2016-07-13 DIAGNOSIS — N183 Chronic kidney disease, stage 3 unspecified: Secondary | ICD-10-CM

## 2016-08-18 ENCOUNTER — Encounter (HOSPITAL_COMMUNITY): Payer: Medicare Other | Attending: Hematology & Oncology | Admitting: Oncology

## 2016-08-18 ENCOUNTER — Encounter (HOSPITAL_COMMUNITY): Payer: Self-pay

## 2016-08-18 VITALS — BP 154/65 | HR 67 | Temp 98.5°F | Resp 18 | Wt 231.7 lb

## 2016-08-18 DIAGNOSIS — H409 Unspecified glaucoma: Secondary | ICD-10-CM

## 2016-08-18 DIAGNOSIS — Z7901 Long term (current) use of anticoagulants: Secondary | ICD-10-CM

## 2016-08-18 DIAGNOSIS — I2699 Other pulmonary embolism without acute cor pulmonale: Secondary | ICD-10-CM

## 2016-08-18 MED ORDER — APIXABAN 5 MG PO TABS: 5.0000 mg | ORAL_TABLET | Freq: Two times a day (BID) | ORAL | 0 refills | Status: AC

## 2016-08-18 NOTE — Progress Notes (Signed)
[facility]  Progress Note  Patient Care Team: [facility] as PCP - General  CHIEF COMPLAINTS/PURPOSE OF CONSULTATION:  Bilateral Pulmonary Emboli CTA chest with R heart strain c/w at least submassive PE 10/15/2015 Xarelto  HISTORY OF PRESENTING ILLNESS:  James Page 76 y.o. male is here for additional follow-up of bilateral pulmonary emboli. 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    He presented with his wife for follow up. He is doing well today. He is on Eliquis now since it is cheaper than the Xarelto by $2. He started eliquis in Jan 2018.  He denies any shortness of breath. Denies easy bruising/bleeding, chest pain, abdominal pain, or any other concerns.   MEDICAL HISTORY:  Past Medical History:  Diagnosis Date  . Cataract   . Glaucoma   . Gout   . Hypertension     SURGICAL HISTORY: Past Surgical History:  Procedure Laterality Date  . CATARACT EXTRACTION    . COLONOSCOPY    . KNEE SURGERY    . MANDIBLE FRACTURE SURGERY      SOCIAL HISTORY: Social History   Social History  . Marital status: Married    Spouse name: N/A  . Number of children: N/A  . Years of education: N/A   Occupational History  . Not on file.   Social History Main Topics  . Smoking status: Former Games developer  . Smokeless tobacco: Never Used  . Alcohol use No  . Drug use: No  . Sexual activity: Not on file     Comment: Married   Other Topics Concern  . Not on file   Social History Narrative  . No narrative on file  Married 54 years, one child living, one child deceased. One step grandchild. Was a truck Investment banker, corporate. 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   FAMILY HISTORY: History reviewed. No pertinent family history. Father died at the age of 85, blood was "too thick" Mother died in her 80's from a CVA 8 half siblings 5 full siblings  No siblings with history of DVT or PE  ALLERGIES:  is allergic to codeine.  MEDICATIONS:  Current Outpatient  Prescriptions  Medication Sig Dispense Refill  . colchicine 0.6 MG tablet Take 0.6 mg by mouth daily as needed (gout).    Marland Kitchen doxazosin (CARDURA) 8 MG tablet Take 1 tablet by mouth daily.    Marland Kitchen GARLIC PO Take 1 tablet by mouth daily.    Marland Kitchen losartan (COZAAR) 25 MG tablet Take 1 tablet by mouth daily.  2  . rivaroxaban (XARELTO) 20 MG TABS tablet Take 1 tablet (20 mg total) by mouth daily with supper. 30 tablet 3  . senna-docusate (SENOKOT-S) 8.6-50 MG tablet Take 2 tablets by mouth at bedtime. 60 tablet 0   No current facility-administered medications for this visit.    Review of Systems  Constitutional: Negative.   HENT: Negative.   Eyes:       Blind from glaucoma.   Respiratory: Positive for shortness of breath (mild).   Cardiovascular: Positive for leg swelling. Negative for chest pain.  Gastrointestinal: Negative.  Negative for abdominal pain.  Genitourinary: Negative.   Musculoskeletal: Negative.   Skin: Negative.   Neurological: Negative.   Endo/Heme/Allergies: Negative.  Does not bruise/bleed easily.  Psychiatric/Behavioral: Negative.   All other systems reviewed and are negative. 14 point ROS was done and is otherwise as detailed above or in HPI  PHYSICAL EXAMINATION: ECOG PERFORMANCE STATUS: 1 - Symptomatic but  completely ambulatory  Vitals:   08/18/16 1056  BP: (!) 154/65  Pulse: 67  Resp: 18  Temp: 98.5 F (36.9 C)   Filed Weights   08/18/16 1056  Weight: 231 lb 11.2 oz (105.1 kg)   Physical Exam  Constitutional: He is oriented to person, place, and time and well-developed, well-nourished, and in no distress.  HENT:  Head: Normocephalic and atraumatic.  Nose: Nose normal.  Mouth/Throat: Oropharynx is clear and moist. No oropharyngeal exudate.  Eyes: Conjunctivae and EOM are normal. Pupils are equal, round, and reactive to light. Right eye exhibits no discharge. Left eye exhibits no discharge. No scleral icterus.  Neck: Normal range of motion. Neck supple. No  tracheal deviation present. No thyromegaly present.  Cardiovascular: Normal rate, regular rhythm and normal heart sounds.  Exam reveals no gallop and no friction rub.   No murmur heard. Pulmonary/Chest: Effort normal and breath sounds normal. He has no wheezes. He has no rales.  Abdominal: Soft. Bowel sounds are normal. He exhibits no distension and no mass. There is no tenderness. There is no rebound and no guarding.  Musculoskeletal: Normal range of motion. He exhibits no edema or deformity.  Chronic L ankle swelling from previously broken ankle  Lymphadenopathy:    He has no cervical adenopathy.  Neurological: He is alert and oriented to person, place, and time. He has normal reflexes. No cranial nerve deficit. Coordination normal.  Skin: Skin is warm and dry. No rash noted.  Psychiatric: Mood, memory, affect and judgment normal.  Nursing note and vitals reviewed.  LABORATORY DATA:  I have reviewed the data as listed Lab Results  Component Value Date   WBC 6.1 10/18/2015   HGB 12.1 (L) 10/18/2015   HCT 35.6 (L) 10/18/2015   MCV 88.1 10/18/2015   PLT 166 10/18/2015   CMP     Component Value Date/Time   NA 139 10/17/2015 0913   K 3.6 10/17/2015 0913   CL 110 10/17/2015 0913   CO2 22 10/17/2015 0913   GLUCOSE 128 (H) 10/17/2015 0913   BUN 16 10/17/2015 0913   CREATININE 1.24 10/17/2015 0913   CALCIUM 8.9 10/17/2015 0913   GFRNONAA 55 (L) 10/17/2015 0913   GFRAA >60 10/17/2015 0913   RADIOGRAPHIC STUDIES: I have personally reviewed the radiological images as listed and agreed with the findings in the report.  Renal US 07/13/2016 IMPRESSION: Kidneys normal length but with mild cortical thinning and mild increased echogenicity. No hydronephrosis. Simple appearing cysts bilaterally.  ASSESSMENT & PLAN:  Bilateral Pulmonary Emboli Submassive PE CTA chest with R heart strain c/w at least submassive PE 10/15/2015 Hypercoagulable workup in 11/2015 was negative. Was on Xarelto  since diagnosis in June 2017, but transitioned to Eliquis in Jan 2018 due to better cost  We have discussed his PE today. Since this was his first episode of thromboembolism, and given the fact that he does have risk factors for bleeding while on anticoagulation given his age and blindness which puts him at increased risk for falls, I have recommended that we stop anticoagulation at the end of June if his repeat CTA chest is negative for PE.   Refilled eliquis today for 2 months.  Repeat CTA chest ordered for 1-2 days prior to next visit.  RTC in 2 months for follow up.  All questions were answered. The patient knows to call the clinic with any problems, questions or concerns.  This document serves as a record of services personally performed by Ralene Cork, MD.  It was created on her behalf by [place] Casey, a trained medical scribe. The creation of this record is based on the scribe's personal observations and the provider's statements to them. This document has been checked and approved by the attending provider.  I have reviewed the above documentation for accuracy and completeness and I agree with the above.  This note was electronically signed.    [place] M Casey  08/18/2016 11:05 AM

## 2016-08-18 NOTE — Patient Instructions (Addendum)
[facility] at [facility] Discharge Instructions  RECOMMENDATIONS MADE BY THE CONSULTANT AND ANY TEST RESULTS WILL BE SENT TO YOUR REFERRING PHYSICIAN.  You were seen today by Dr. Ralene Cork We will schedule CT scan of chest in 2 months  Follow up in 2 months after scans    Thank you for choosing [facility] at [facility] to provide your oncology and hematology care.  To afford each patient quality time with our provider, please arrive at least 15 minutes before your scheduled appointment time.    If you have a lab appointment with the Cancer Center please come in thru the  Main Entrance and check in at the main information desk  You need to re-schedule your appointment should you arrive 10 or more minutes late.  We strive to give you quality time with our providers, and arriving late affects you and other patients whose appointments are after yours.  Also, if you no show three or more times for appointments you may be dismissed from the clinic at the providers discretion.     Again, thank you for choosing [facility].  Our hope is that these requests will decrease the amount of time that you wait before being seen by our physicians.       _____________________________________________________________  Should you have questions after your visit to [facility], please contact our office at [phone] between the hours of 8:30 a.m. and 4:30 p.m.  Voicemails left after 4:30 p.m. will not be returned until the following business day.  For prescription refill requests, have your pharmacy contact our office.       Resources For Cancer Patients and their Caregivers ? American Cancer Society: Can assist with transportation, wigs, general needs, runs Look Good Feel Better.        9387366846 ? Cancer Care: Provides financial assistance, online support groups, medication/co-pay assistance.  1-800-813-HOPE  (867)421-3580) ? [facility] Assists [place] cancer patients and their families through emotional , educational and financial support.  930-324-3375 ? [place] DSS Where to apply for food stamps, Medicaid and utility assistance. (226)239-8853 ? RCATS: Transportation to medical appointments. (808) 617-9464 ? Social Security Administration: May apply for disability if have a Stage IV cancer. 701-846-8816 901-209-9767 ? [employer], Disability and Transit Services: Assists with nutrition, care and transit needs. 908-745-9813  Cancer Center Support Programs: @ > Cancer Support Group  2nd Tuesday of the month 1pm-2pm, Journey Room  > Creative Journey  3rd Tuesday of the month 1130am-1pm, Journey Room  > Look Good Feel Better  1st Wednesday of the month 10am-12 noon, Journey Room (Call American Cancer Society to register [phone])

## 2016-10-16 ENCOUNTER — Ambulatory Visit (HOSPITAL_COMMUNITY)
Admission: RE | Admit: 2016-10-16 | Discharge: 2016-10-16 | Disposition: A | Discharge status: Home / Self Care | Payer: Medicare Other | Source: Ambulatory Visit | Attending: Oncology | Admitting: Oncology

## 2016-10-16 DIAGNOSIS — I517 Cardiomegaly: Secondary | ICD-10-CM | POA: Insufficient documentation

## 2016-10-16 DIAGNOSIS — Z09 Encounter for follow-up examination after completed treatment for conditions other than malignant neoplasm: Secondary | ICD-10-CM | POA: Insufficient documentation

## 2016-10-16 DIAGNOSIS — Z86711 Personal history of pulmonary embolism: Secondary | ICD-10-CM | POA: Insufficient documentation

## 2016-10-16 DIAGNOSIS — I2699 Other pulmonary embolism without acute cor pulmonale: Secondary | ICD-10-CM

## 2016-10-16 DIAGNOSIS — I700 Atherosclerosis of aorta: Secondary | ICD-10-CM | POA: Insufficient documentation

## 2016-10-16 DIAGNOSIS — J439 Emphysema, unspecified: Secondary | ICD-10-CM | POA: Insufficient documentation

## 2016-10-16 LAB — POCT I-STAT CREATININE: CREATININE: 1.6 mg/dL — AB (ref 0.61–1.24)

## 2016-10-16 MED ORDER — IOPAMIDOL (ISOVUE-370) INJECTION 76%: 100.0000 mL | Freq: Once | INTRAVENOUS | Status: DC | PRN

## 2016-10-18 MED ORDER — IOPAMIDOL (ISOVUE-370) INJECTION 76%
100.0000 mL | Freq: Once | INTRAVENOUS | Status: AC | PRN
  Administered 2016-10-16: 100 mL via INTRAVENOUS

## 2016-10-20 ENCOUNTER — Encounter (HOSPITAL_COMMUNITY): Payer: Medicare Other | Attending: Hematology & Oncology | Admitting: Oncology

## 2016-10-20 ENCOUNTER — Encounter (HOSPITAL_COMMUNITY): Payer: Self-pay

## 2016-10-20 ENCOUNTER — Ambulatory Visit (HOSPITAL_COMMUNITY): Payer: Medicare Other

## 2016-10-20 VITALS — BP 142/66 | HR 81 | Temp 98.1°F | Resp 18 | Wt 230.0 lb

## 2016-10-20 DIAGNOSIS — H409 Unspecified glaucoma: Secondary | ICD-10-CM | POA: Diagnosis not present

## 2016-10-20 DIAGNOSIS — Z86711 Personal history of pulmonary embolism: Secondary | ICD-10-CM | POA: Diagnosis not present

## 2016-10-20 DIAGNOSIS — H4300 Vitreous prolapse, unspecified eye: Secondary | ICD-10-CM | POA: Diagnosis not present

## 2016-10-20 DIAGNOSIS — I2699 Other pulmonary embolism without acute cor pulmonale: Secondary | ICD-10-CM

## 2016-10-20 NOTE — Progress Notes (Signed)
[facility]  Progress Note  [facility] Care Team: [facility] - General  CHIEF COMPLAINTS/PURPOSE OF CONSULTATION:  Bilateral Pulmonary Emboli CTA chest with R heart strain c/w at least submassive PE 10/15/2015 Xarelto  HISTORY OF PRESENTING ILLNESS:  James Page 76 y.o. male is here for additional follow-up of bilateral pulmonary emboli. 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    He presented with his wife for follow up. He is doing well today. He is on Eliquis and has not had any bleeding complications from his last visit. He does have some exertional shortness of breath.  He easy bruising/bleeding, chest pain, abdominal pain, or any other concerns.   MEDICAL HISTORY:  Past Medical History:  Diagnosis Date  . Cataract   . Glaucoma   . Gout   . Hypertension     SURGICAL HISTORY: Past Surgical History:  Procedure Laterality Date  . CATARACT EXTRACTION    . COLONOSCOPY    . KNEE SURGERY    . MANDIBLE FRACTURE SURGERY      SOCIAL HISTORY: Social History   Social History  . Marital status: Married    Spouse name: N/A  . Number of children: N/A  . Years of education: N/A   Occupational History  . Not on file.   Social History Main Topics  . Smoking status: Former Games developermoker  . Smokeless tobacco: Never Used  . Alcohol use No  . Drug use: No  . Sexual activity: Not on file     Comment: Married   Other Topics Concern  . Not on file   Social History Narrative  . No narrative on file  Married 54 years, one child living, one child deceased. One step grandchild. Was a truck Investment banker, corporatedriver and farmer. 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   FAMILY HISTORY: History reviewed. No pertinent family history. Father died at the age of 78, blood was "too thick" Mother died in her 5880's from a CVA 8 half siblings 5 full siblings  No siblings with history of DVT or PE  ALLERGIES:  is allergic to codeine.  MEDICATIONS:  Current Outpatient  Prescriptions  Medication Sig Dispense Refill  . apixaban (ELIQUIS) 5 MG TABS tablet Take 1 tablet (5 mg total) by mouth 2 (two) times daily. 60 tablet 0  . colchicine 0.6 MG tablet Take 0.6 mg by mouth daily as needed (gout).    Marland Kitchen. doxazosin (CARDURA) 8 MG tablet Take 1 tablet by mouth daily.    Marland Kitchen. GARLIC PO Take 1 tablet by mouth daily.    Marland Kitchen. losartan (COZAAR) 25 MG tablet Take 1 tablet by mouth daily.  2  . senna-docusate (SENOKOT-S) 8.6-50 MG tablet Take 2 tablets by mouth at bedtime. 60 tablet 0   No current facility-administered medications for this visit.    Review of Systems  Constitutional: Negative.   HENT: Negative.   Eyes:       Blind from glaucoma.   Respiratory: Negative for shortness of breath.        Chronic SOB  Cardiovascular: Negative for chest pain and leg swelling.  Gastrointestinal: Negative.  Negative for abdominal pain.  Genitourinary: Negative.   Musculoskeletal: Negative.   Skin: Negative.   Neurological: Negative.   Endo/Heme/Allergies: Negative.  Does not bruise/bleed easily.  Psychiatric/Behavioral: Negative.   All other systems reviewed and are negative. 14 point ROS was done and is otherwise as detailed above or in HPI  PHYSICAL EXAMINATION: ECOG PERFORMANCE STATUS:  1 - Symptomatic but completely ambulatory  Vitals:   10/20/16 1250  BP: (!) 142/66  Pulse: 81  Resp: 18  Temp: 98.1 F (36.7 C)   Filed Weights   10/20/16 1250  Weight: 230 lb (104.3 kg)   Physical Exam  Constitutional: He is oriented to person, place, and time and well-developed, well-nourished, and in no distress.  HENT:  Head: Normocephalic and atraumatic.  Nose: Nose normal.  Mouth/Throat: Oropharynx is clear and moist. No oropharyngeal exudate.  Patient is blind  Eyes: Right eye exhibits no discharge. Left eye exhibits no discharge. No scleral icterus.  Neck: Normal range of motion. Neck supple. No tracheal deviation present. No thyromegaly present.  Cardiovascular:  Normal rate, regular rhythm and normal heart sounds.  Exam reveals no gallop and no friction rub.   No murmur heard. Pulmonary/Chest: Effort normal and breath sounds normal. He has no wheezes. He has no rales.  Abdominal: Soft. Bowel sounds are normal. He exhibits no distension and no mass. There is no tenderness. There is no rebound and no guarding.  Musculoskeletal: Normal range of motion. He exhibits no edema or deformity.  Chronic L ankle swelling from previously broken ankle  Lymphadenopathy:    He has no cervical adenopathy.  Neurological: He is alert and oriented to person, place, and time. He has normal reflexes. No cranial nerve deficit. Coordination normal.  Skin: Skin is warm and dry. No rash noted.  Psychiatric: Mood, memory, affect and judgment normal.  Nursing note and vitals reviewed.  LABORATORY DATA:  I have reviewed the data as listed Lab Results  Component Value Date   WBC 6.1 10/18/2015   HGB 12.1 (L) 10/18/2015   HCT 35.6 (L) 10/18/2015   MCV 88.1 10/18/2015   PLT 166 10/18/2015   CMP     Component Value Date/Time   NA 139 10/17/2015 0913   K 3.6 10/17/2015 0913   CL 110 10/17/2015 0913   CO2 22 10/17/2015 0913   GLUCOSE 128 (H) 10/17/2015 0913   BUN 16 10/17/2015 0913   CREATININE 1.60 (H) 10/16/2016 1115   CALCIUM 8.9 10/17/2015 0913   GFRNONAA 55 (L) 10/17/2015 0913   GFRAA >60 10/17/2015 0913   RADIOGRAPHIC STUDIES: I have personally reviewed the radiological images as listed and agreed with the findings in the report.  Renal US 07/13/2016 IMPRESSION: Kidneys normal length but with mild cortical thinning and mild increased echogenicity. No hydronephrosis. Simple appearing cysts bilaterally.  CTA chest 10/16/16:  IMPRESSION: 1. The 2017 bilateral pulmonary emboli appear resolved with no adverse features or new pulmonary thrombus. 2. Mild cardiomegaly. 3. No acute pulmonary findings.  ASSESSMENT & PLAN:  Bilateral Pulmonary  Emboli Submassive PE CTA chest with R heart strain c/w at least submassive PE 10/15/2015 Hypercoagulable workup in 11/2015 was negative. Was on Xarelto since diagnosis in June 2017, but transitioned to Eliquis in Jan 2018 due to better cost  We have discussed his PE today. It has completely resolved on his repeat imaging with CT angiogram of the chest on 10/16/16. Since this was his first episode of thromboembolism, and given the fact that he does have risk factors for bleeding while on anticoagulation given his age and blindness which puts him at increased risk for falls, I have recommended that we stop anticoagulation at this time.  Patient states that his primary care physician is concerned that he may develop another blood clot if he is taking off of anticoagulation. I have discussed with him that he must  weigh the risk and benefits of being on chronic anticoagulation, the current guidelines does not recommend to continue lifelong anticoagulation unless he has more than 2 unprovoked episodes of thromboembolism. I will leave this up to him to discuss with his primary care physician.  At this time I will turn his care over to his PCP. RTC PRN.  All questions were answered. The patient knows to call the clinic with any problems, questions or concerns.    This note was electronically signed.    Ralene Cork, MD  10/20/2016 3:59 PM

## 2016-10-20 NOTE — Patient Instructions (Signed)
[facility] at [facility] Discharge Instructions  RECOMMENDATIONS MADE BY THE CONSULTANT AND ANY TEST RESULTS WILL BE SENT TO YOUR REFERRING PHYSICIAN.  Exam and discussion today with Dr. Zhou. Return as scheduled.   Thank you for choosing  Cancer Center at [facility] to provide your oncology and hematology care.  To afford each patient quality time with our provider, please arrive at least 15 minutes before your scheduled appointment time.    If you have a lab appointment with the Cancer Center please come in thru the  Main Entrance and check in at the main information desk  You need to re-schedule your appointment should you arrive 10 or more minutes late.  We strive to give you quality time with our providers, and arriving late affects you and other patients whose appointments are after yours.  Also, if you no show three or more times for appointments you may be dismissed from the clinic at the providers discretion.     Again, thank you for choosing [facility].  Our hope is that these requests will decrease the amount of time that you wait before being seen by our physicians.       _____________________________________________________________  Should you have questions after your visit to [facility], please contact our office at [phone] between the hours of 8:30 a.m. and 4:30 p.m.  Voicemails left after 4:30 p.m. will not be returned until the following business day.  For prescription refill requests, have your pharmacy contact our office.       Resources For Cancer Patients and their Caregivers ? American Cancer Society: Can assist with transportation, wigs, general needs, runs Look Good Feel Better.        1-888-227-6333 ? Cancer Care: Provides financial assistance, online support groups, medication/co-pay assistance.  1-800-813-HOPE (4673) ? [facility] Assists [place]  cancer patients and their families through emotional , educational and financial support.  336-427-4357 ? [place] DSS Where to apply for food stamps, Medicaid and utility assistance. 336-342-1394 ? RCATS: Transportation to medical appointments. 336-347-2287 ? Social Security Administration: May apply for disability if have a Stage IV cancer. 336-342-7796 1-800-772-1213 ? [place] Aging, Disability and Transit Services: Assists with nutrition, care and transit needs. 336-349-2343  Cancer Center Support Programs: @10RELATIVEDAYS@ > Cancer Support Group  2nd Tuesday of the month 1pm-2pm, Journey Room  > Creative Journey  3rd Tuesday of the month 1130am-1pm, Journey Room  > Look Good Feel Better  1st Wednesday of the month 10am-12 noon, Journey Room (Call American Cancer Society to register 1-800-395-5775)   

## 2017-10-08 IMAGING — CT CT ANGIO CHEST
2 of 6 series · 18 of 46 positions shown · IV contrast (Isovue)
Comparison: CTA chest 10/15/2015

ADDENDUM:
Contrast dose: 100 mL Isovue 370
CLINICAL DATA: 75-year-old male with bilateral pulmonary emboli in
September 2015. Still on blood thinners. Restaging.

EXAM:
CT ANGIOGRAPHY CHEST WITH CONTRAST
TECHNIQUE: Multidetector CT imaging of the chest was performed using the
standard protocol during bolus administration of intravenous
contrast. Multiplanar CT image reconstructions and MIPs were
obtained to evaluate the vascular anatomy.

[Series 5: thins · axial · 0.72mm/px · z∈[-133,+117]mm · 15 of 274 slices shown]
[im 12/274  lung]
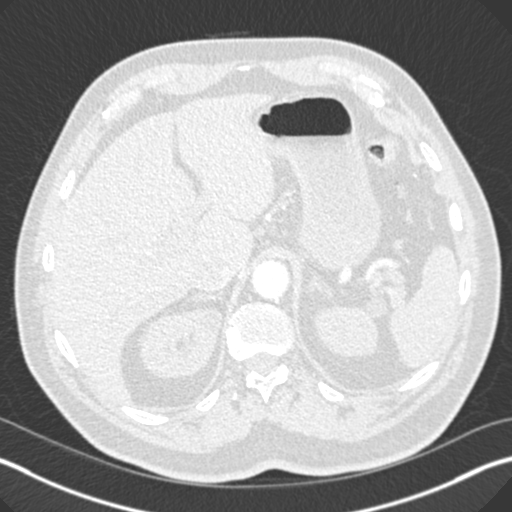
[im 36/274  soft-tissue]
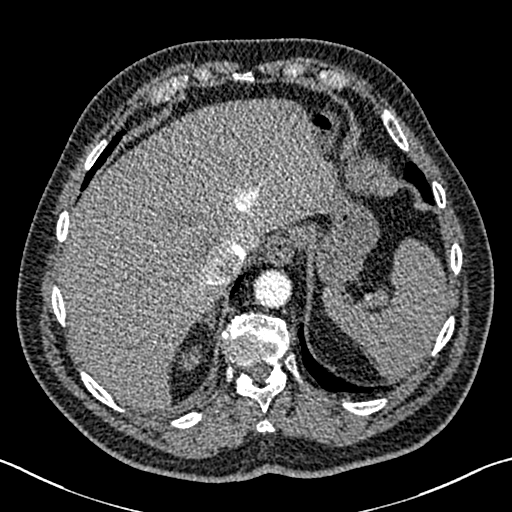
[im 48/274  lung]
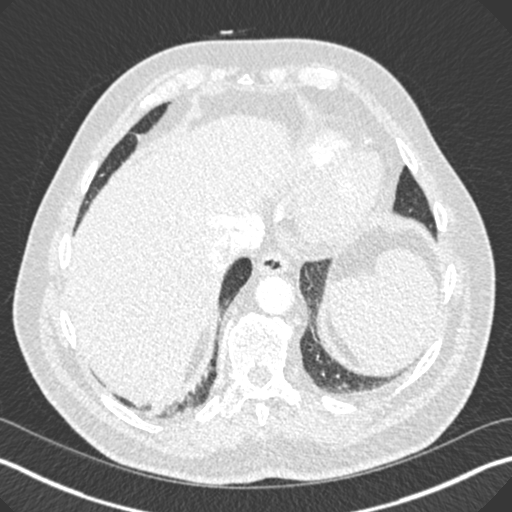
[im 72/274  soft-tissue]
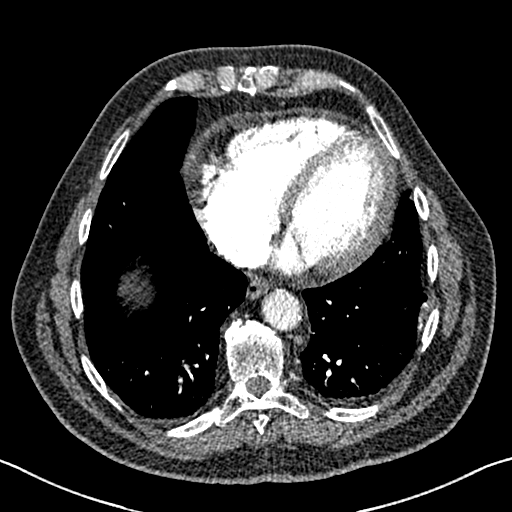
[im 84/274  lung]
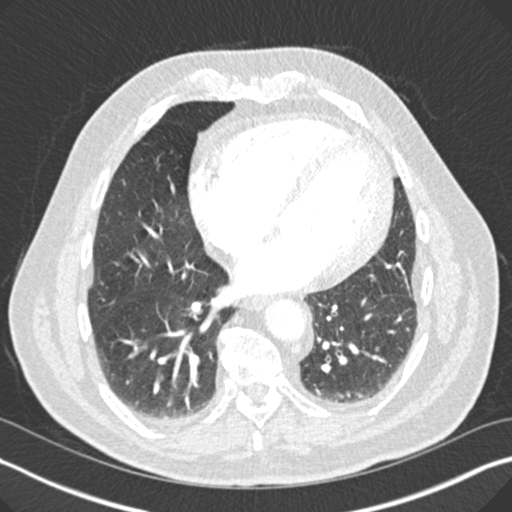
[im 107/274  soft-tissue]
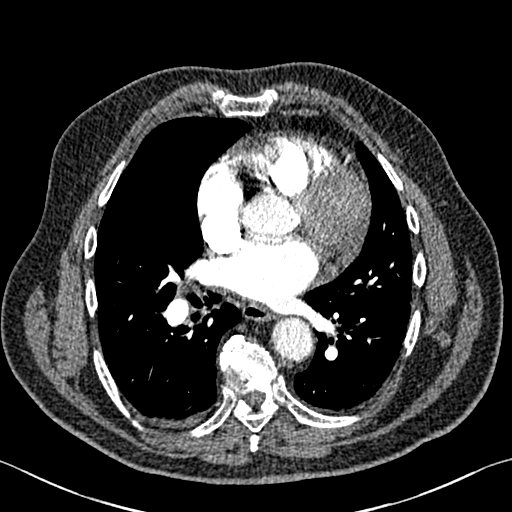
[im 119/274  lung]
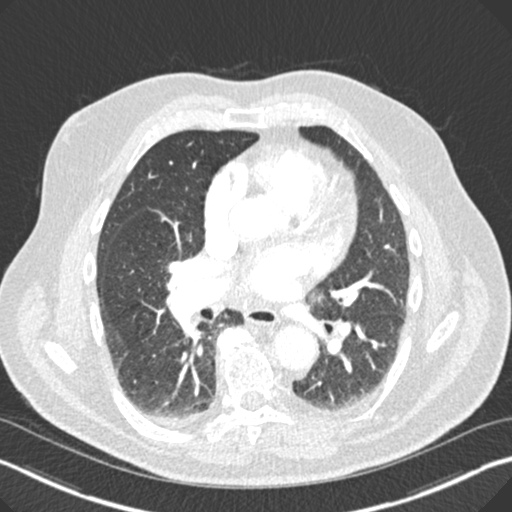
[im 143/274  soft-tissue]
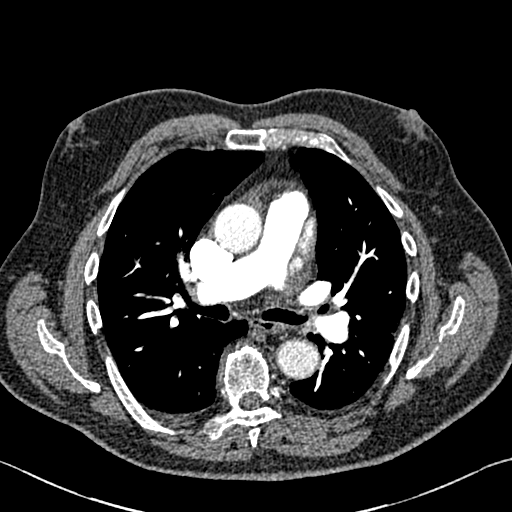
[im 155/274  lung]
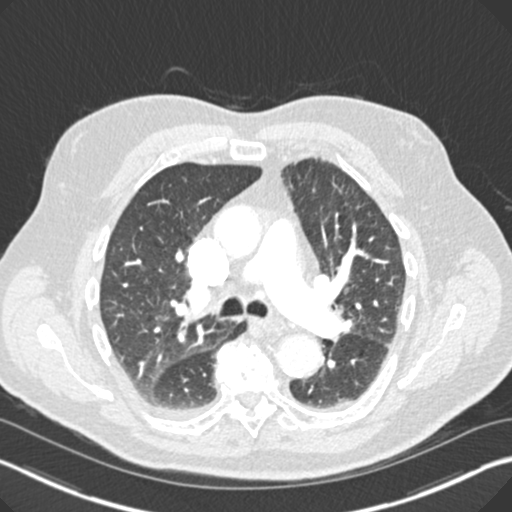
[im 167/274  soft-tissue]
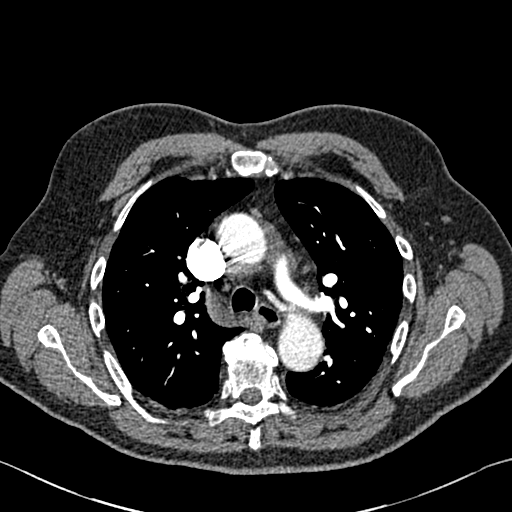
[im 190/274  lung]
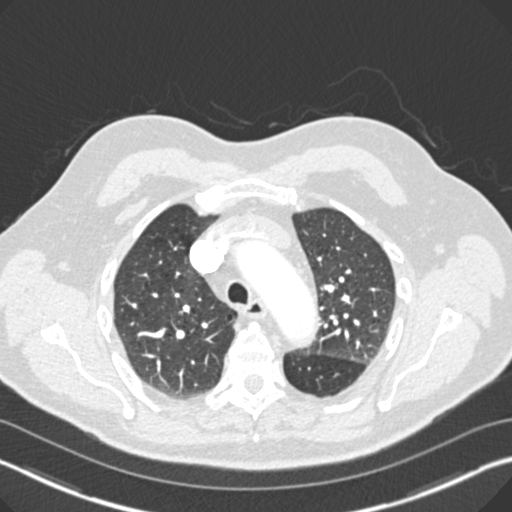
[im 202/274  soft-tissue]
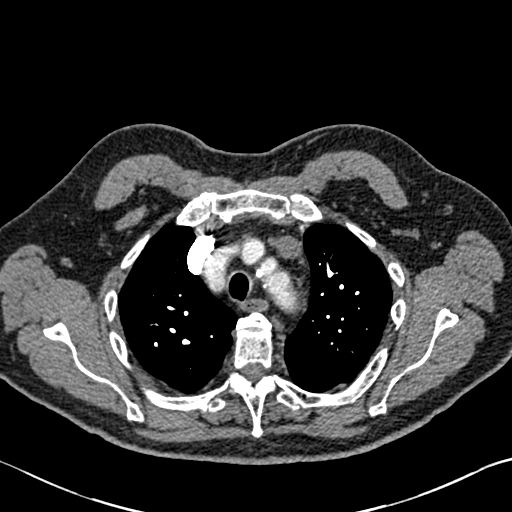
[im 226/274  lung]
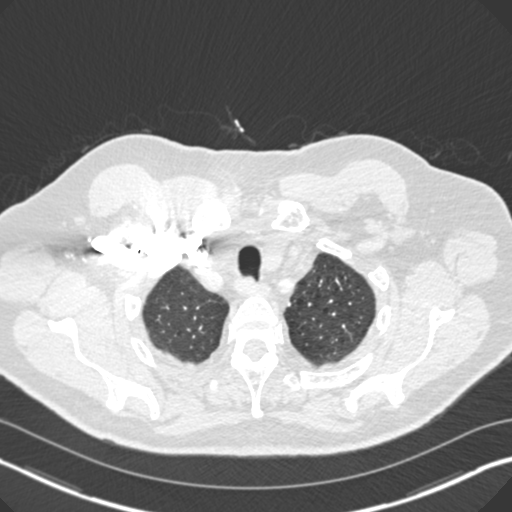
[im 238/274  soft-tissue]
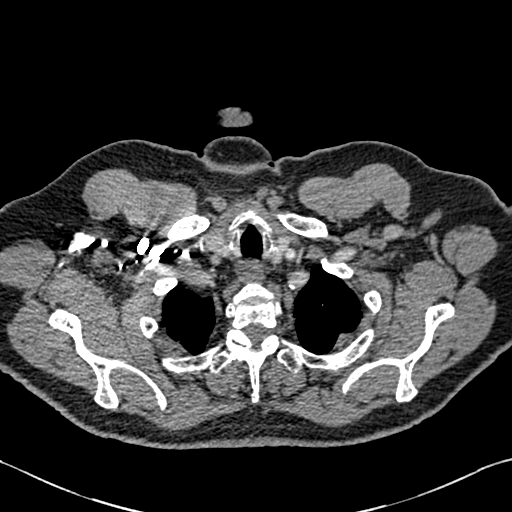
[im 262/274  lung]
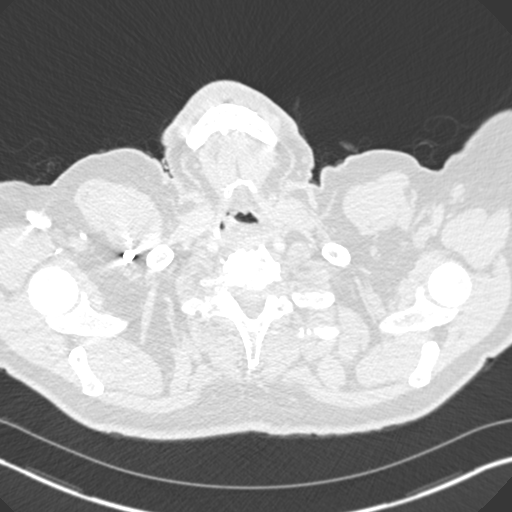

[Series 7: coronal mpr · coronal · 0.56mm/px · 3 of 153 slices shown]
[im 39/153  soft-tissue]
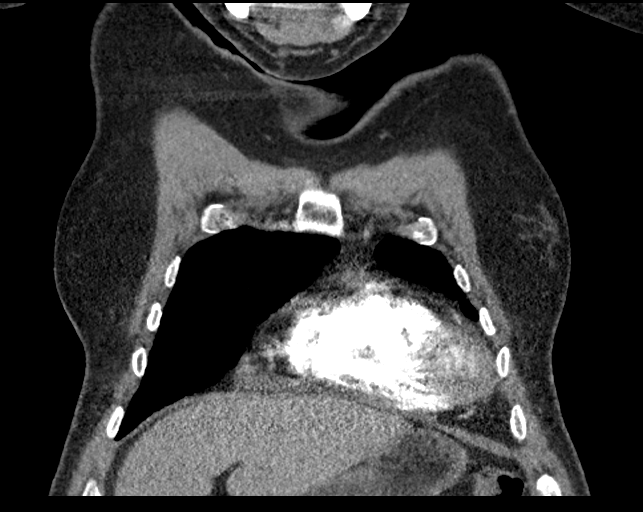
[im 77/153  soft-tissue]
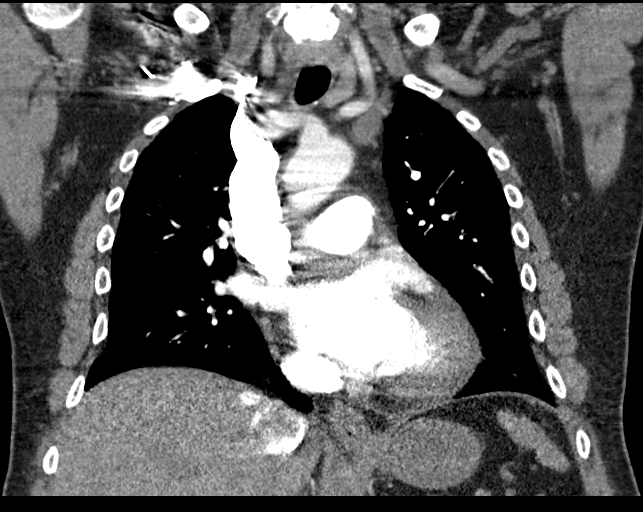
[im 115/153  soft-tissue]
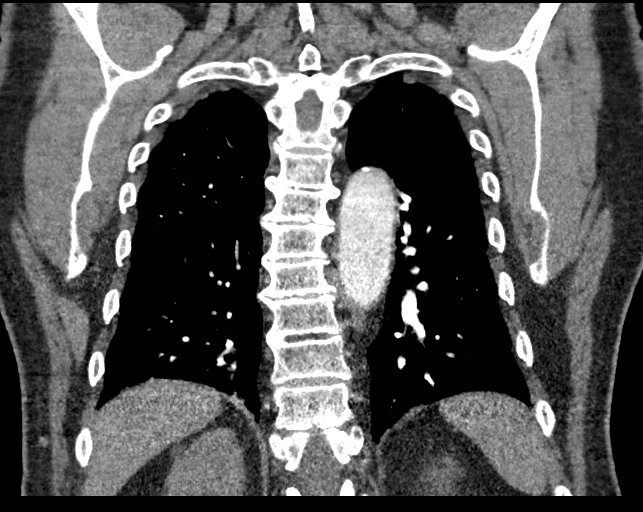

[18 of 46 positions shown; findings below may reference images not displayed]

FINDINGS: Cardiovascular: The previously identified pulmonary artery branches
with low-density thrombus appear recannulized with no adverse
features today. There is mild respiratory motion artifact at the
lung bases, less pronounced than on the 7452 study. The main and
proximal right and left pulmonary arteries appear stable and normal.
No definite pulmonary artery filling defect today. Contrast bolus
timing in the pulmonary arterial tree.

Mild cardiomegaly appears increased. Trace pericardial effusion is
stable and likely physiologic. Calcified aortic and coronary artery
atherosclerosis re- demonstrated. Otherwise negative visualized
thoracic aorta.

Mediastinum/Nodes: Stable small mediastinal lymph nodes. Otherwise
negative.

Lungs/Pleura: Persistent trace layering pleural fluid, or dependent
pleural thickening. No pulmonary infarct or consolidation. Mild
centrilobular emphysema suspected in the upper lobes. Major airways
are patent. Chronic left lung base scarring along the lateral left
major fissure is unchanged.

Upper Abdomen: Negative visualized liver, spleen, pancreas, adrenal
glands, and bowel in the upper abdomen. Stable partially visible
kidneys including small upper pole renal cyst.

Musculoskeletal: Stable degenerative changes and mildly exaggerated
thoracic kyphosis. No acute osseous abnormality identified.

Review of the MIP images confirms the above findings.
IMPRESSION: 1. The 7452 bilateral pulmonary emboli appear resolved with no
adverse features or new pulmonary thrombus.
2. Mild cardiomegaly.
3. No acute pulmonary findings.
4.
Aortic Atherosclerosis (MHSZ2-9VV.V) and Emphysema (MHSZ2-A72.T).

## 2018-07-24 IMAGING — US US RENAL
1 series · 14 of 25 positions shown · non-contrast
Comparison: CT chest 10/15/2015

CLINICAL DATA: Chronic kidney disease stage 3.  Hypertension.

EXAM:
RENAL / URINARY TRACT ULTRASOUND COMPLETE

[Series 1: us renal · 0.28mm/px · 14 of 65 slices shown]
[im 1/65]
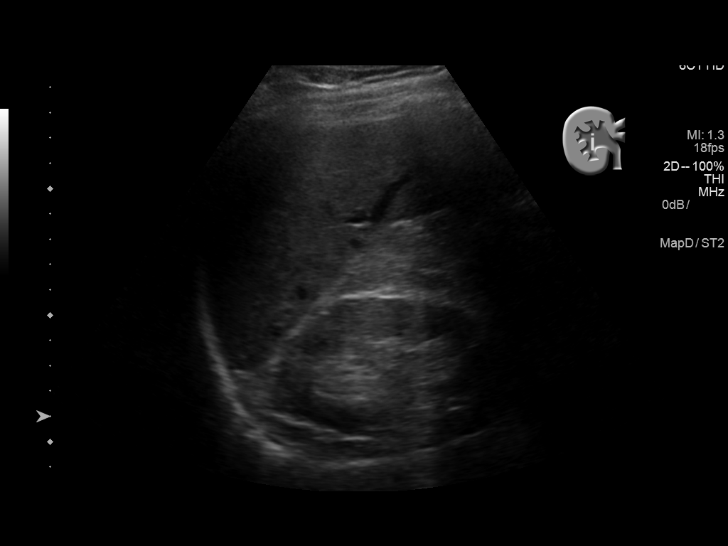
[im 6/65]
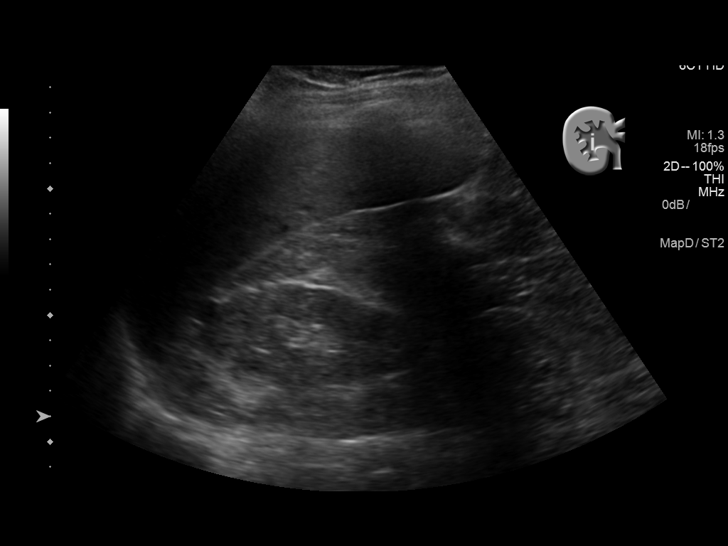
[im 11/65]
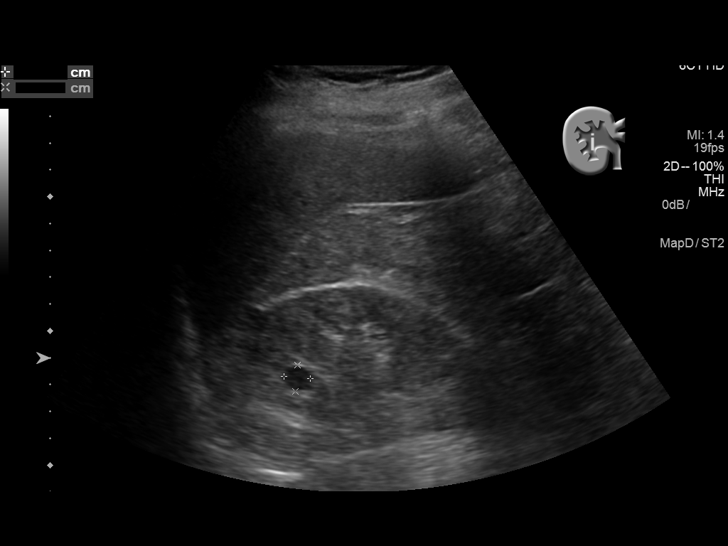
[im 17/65]
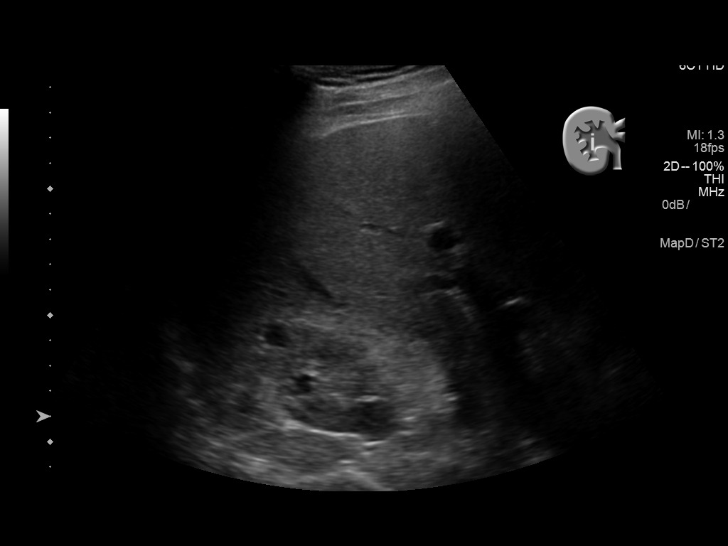
[im 22/65]
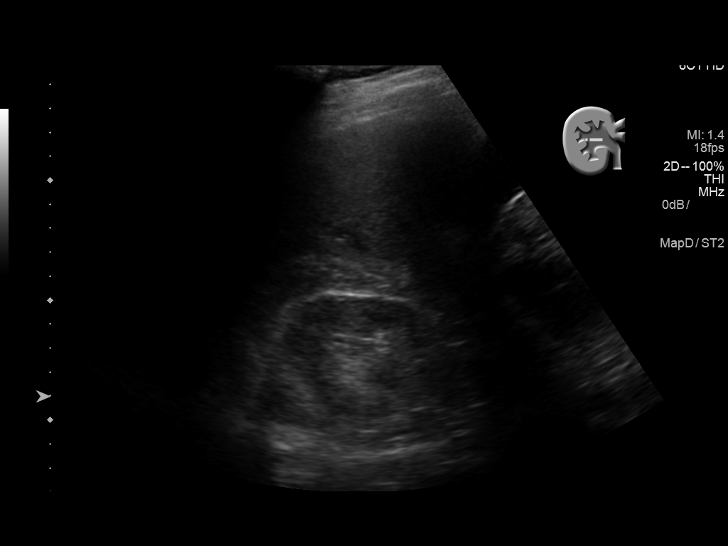
[im 25/65]
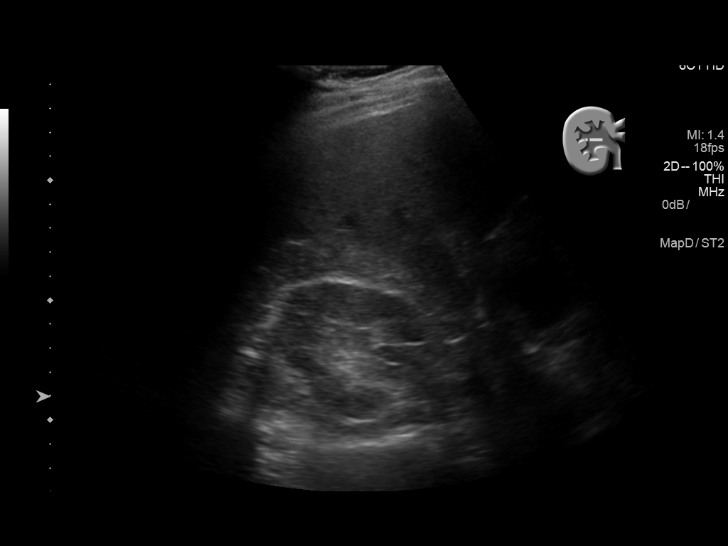
[im 30/65]
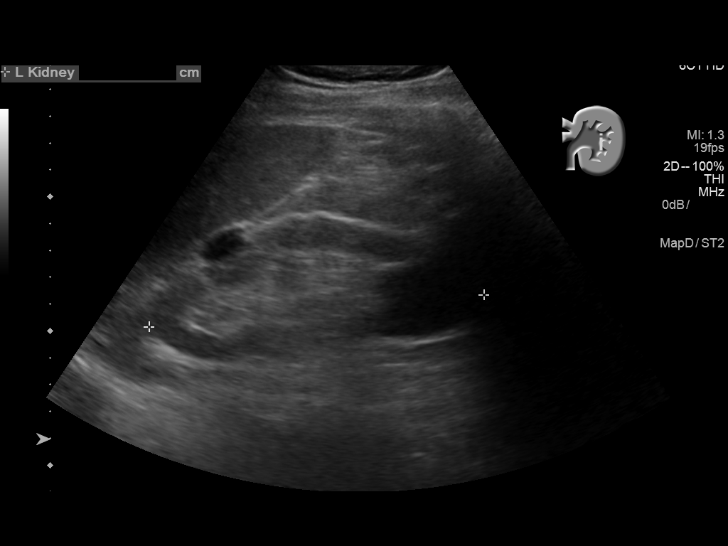
[im 35/65]
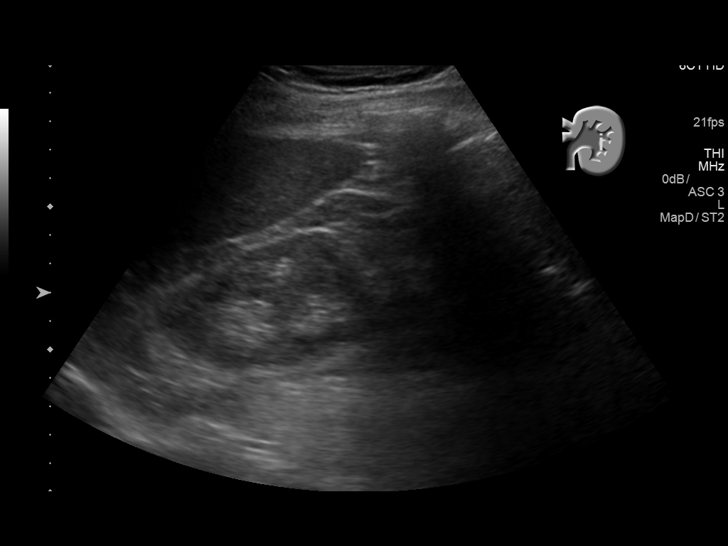
[im 41/65]
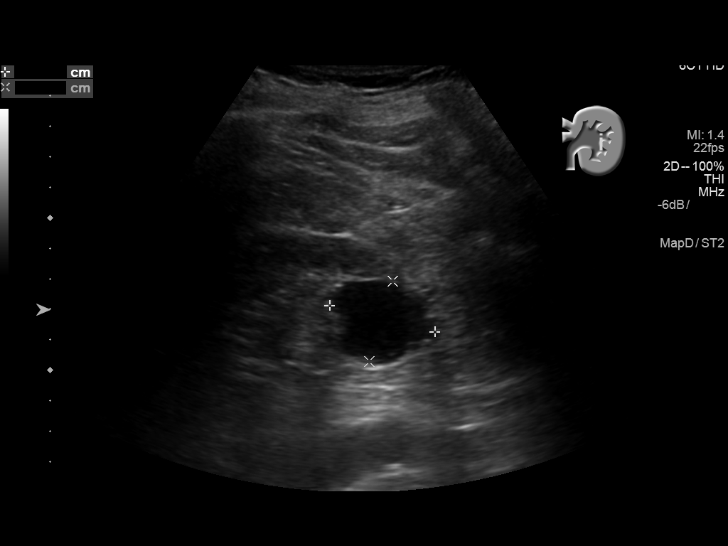
[im 43/65]
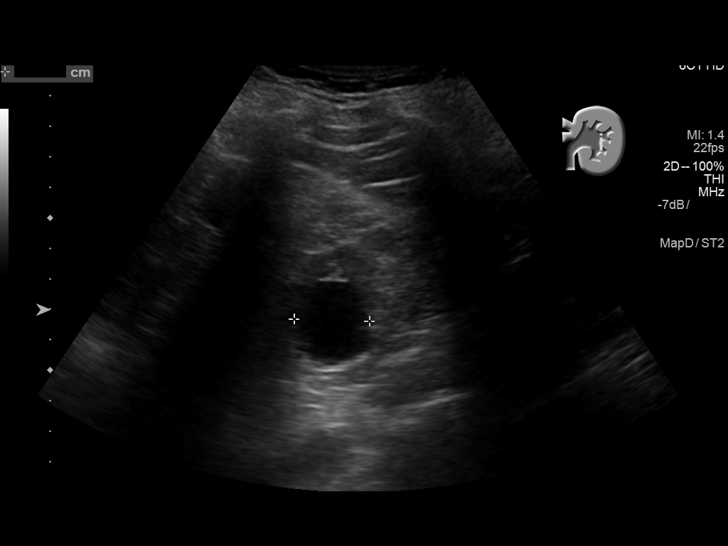
[im 49/65]
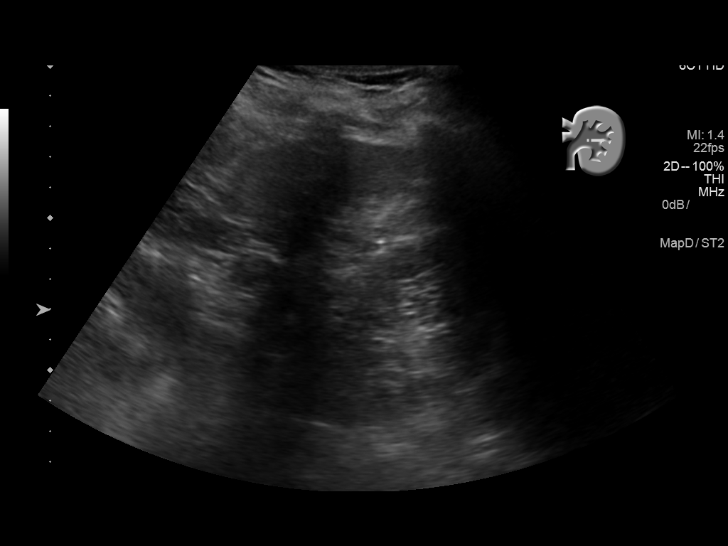
[im 54/65]
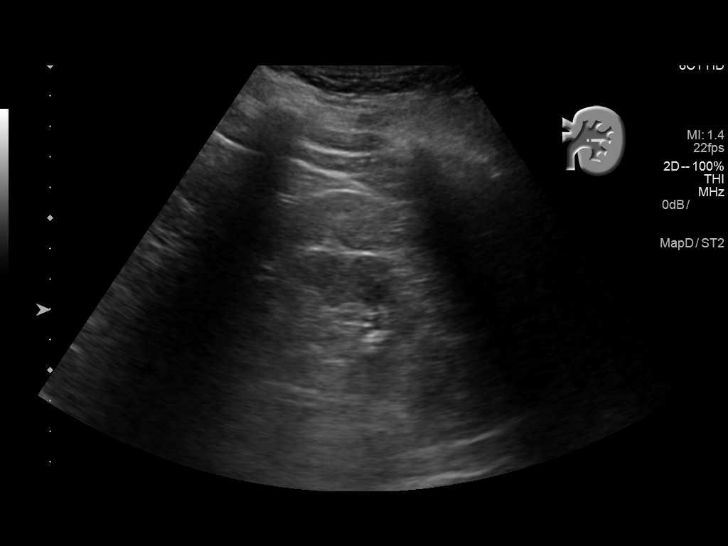
[im 59/65]
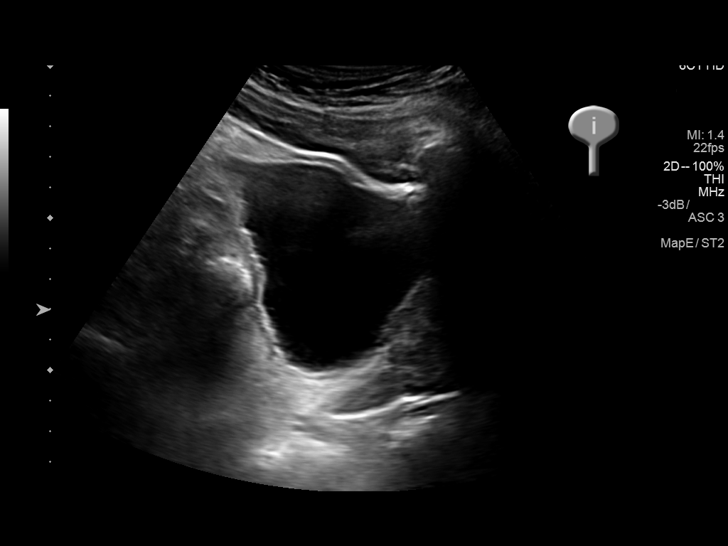
[im 65/65]
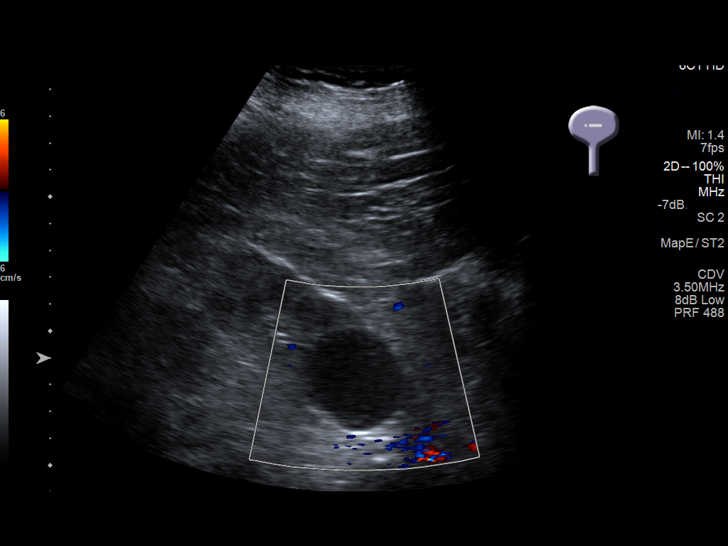

[14 of 25 positions shown; findings below may reference images not displayed]

FINDINGS: Right Kidney:

Length: 10.5 cm.. Echogenicity normal to slightly increased.
Cortical volume slightly thinned. No hydronephrosis. Three simple
appearing cysts, the largest at the lower pole measuring 4.3 x 3.7 x
4.5 cm.

Left Kidney:

Length: 12.5 cm in length.. Slight cortical thinning. Echogenicity
normal to slightly increased. 2 benign appearing cysts, the largest
in the lower kidney measuring 3.6 x 2.8 x 2.5 cm.

Bladder:

Appears normal for degree of bladder distention.
IMPRESSION: Kidneys normal length but with mild cortical thinning and mild
increased echogenicity. No hydronephrosis. Simple appearing cysts
bilaterally.
